# Patient Record
Sex: Male | Born: 1945 | Race: Black or African American | Hispanic: No | Marital: Single | Smoking: Current every day smoker
Health system: Southern US, Community
[De-identification: ages and names within clinical notes are randomized; demographics above are authoritative.]

## PROBLEM LIST (undated history)

## (undated) DIAGNOSIS — N400 Enlarged prostate without lower urinary tract symptoms: Secondary | ICD-10-CM

## (undated) DIAGNOSIS — R0600 Dyspnea, unspecified: Secondary | ICD-10-CM

## (undated) DIAGNOSIS — I471 Supraventricular tachycardia, unspecified: Secondary | ICD-10-CM

## (undated) DIAGNOSIS — R002 Palpitations: Secondary | ICD-10-CM

## (undated) DIAGNOSIS — I10 Essential (primary) hypertension: Secondary | ICD-10-CM

## (undated) DIAGNOSIS — Z720 Tobacco use: Secondary | ICD-10-CM

## (undated) DIAGNOSIS — R55 Syncope and collapse: Secondary | ICD-10-CM

## (undated) DIAGNOSIS — R0602 Shortness of breath: Secondary | ICD-10-CM

## (undated) DIAGNOSIS — R0609 Other forms of dyspnea: Secondary | ICD-10-CM

## (undated) DIAGNOSIS — R079 Chest pain, unspecified: Secondary | ICD-10-CM

## (undated) DIAGNOSIS — I472 Ventricular tachycardia, unspecified: Secondary | ICD-10-CM

## (undated) DIAGNOSIS — I4891 Unspecified atrial fibrillation: Secondary | ICD-10-CM

## (undated) DIAGNOSIS — R0989 Other specified symptoms and signs involving the circulatory and respiratory systems: Secondary | ICD-10-CM

## (undated) DIAGNOSIS — K922 Gastrointestinal hemorrhage, unspecified: Secondary | ICD-10-CM

## (undated) DIAGNOSIS — K219 Gastro-esophageal reflux disease without esophagitis: Secondary | ICD-10-CM

## (undated) HISTORY — DX: Shortness of breath: R06.02

## (undated) HISTORY — DX: Syncope and collapse: R55

## (undated) HISTORY — DX: Supraventricular tachycardia: I47.1

## (undated) HISTORY — DX: Other forms of dyspnea: R06.09

## (undated) HISTORY — DX: Ventricular tachycardia: I47.2

## (undated) HISTORY — DX: Chest pain, unspecified: R07.9

## (undated) HISTORY — DX: Palpitations: R00.2

## (undated) HISTORY — DX: Gastrointestinal hemorrhage, unspecified: K92.2

## (undated) HISTORY — DX: Supraventricular tachycardia, unspecified: I47.10

## (undated) HISTORY — DX: Dyspnea, unspecified: R06.00

## (undated) HISTORY — DX: Ventricular tachycardia, unspecified: I47.20

## (undated) HISTORY — DX: Other specified symptoms and signs involving the circulatory and respiratory systems: R09.89

## (undated) HISTORY — DX: Gastro-esophageal reflux disease without esophagitis: K21.9

## (undated) HISTORY — DX: Essential (primary) hypertension: I10

## (undated) HISTORY — DX: Tobacco use: Z72.0

---

## 2012-04-20 DIAGNOSIS — K922 Gastrointestinal hemorrhage, unspecified: Secondary | ICD-10-CM

## 2012-04-20 HISTORY — DX: Gastrointestinal hemorrhage, unspecified: K92.2

## 2013-02-17 ENCOUNTER — Encounter: Payer: Self-pay | Admitting: Cardiovascular Disease

## 2013-02-18 DIAGNOSIS — R079 Chest pain, unspecified: Secondary | ICD-10-CM

## 2013-02-18 DIAGNOSIS — R55 Syncope and collapse: Secondary | ICD-10-CM

## 2013-02-18 HISTORY — DX: Chest pain, unspecified: R07.9

## 2013-02-18 HISTORY — DX: Syncope and collapse: R55

## 2013-02-20 ENCOUNTER — Encounter: Payer: Self-pay | Admitting: *Deleted

## 2013-02-20 ENCOUNTER — Other Ambulatory Visit: Payer: Self-pay | Admitting: *Deleted

## 2013-02-21 ENCOUNTER — Encounter (HOSPITAL_COMMUNITY): Payer: Self-pay | Admitting: *Deleted

## 2013-02-21 ENCOUNTER — Encounter (HOSPITAL_COMMUNITY): Payer: Self-pay | Admitting: Pharmacy Technician

## 2013-02-21 ENCOUNTER — Encounter (HOSPITAL_COMMUNITY)
Admission: RE | Disposition: A | Discharge status: Home / Self Care | Payer: Self-pay | Source: Ambulatory Visit | Attending: Cardiovascular Disease

## 2013-02-21 ENCOUNTER — Ambulatory Visit (HOSPITAL_COMMUNITY)
Admission: RE | Admit: 2013-02-21 | Discharge: 2013-02-21 | Disposition: A | Discharge status: Home / Self Care | Payer: BC Managed Care – PPO | Source: Ambulatory Visit | Attending: Cardiovascular Disease | Admitting: Cardiovascular Disease

## 2013-02-21 DIAGNOSIS — R55 Syncope and collapse: Secondary | ICD-10-CM | POA: Insufficient documentation

## 2013-02-21 DIAGNOSIS — I4891 Unspecified atrial fibrillation: Secondary | ICD-10-CM

## 2013-02-21 DIAGNOSIS — I472 Ventricular tachycardia, unspecified: Secondary | ICD-10-CM | POA: Insufficient documentation

## 2013-02-21 DIAGNOSIS — R079 Chest pain, unspecified: Secondary | ICD-10-CM | POA: Insufficient documentation

## 2013-02-21 DIAGNOSIS — I2089 Other forms of angina pectoris: Secondary | ICD-10-CM

## 2013-02-21 DIAGNOSIS — I10 Essential (primary) hypertension: Secondary | ICD-10-CM | POA: Insufficient documentation

## 2013-02-21 DIAGNOSIS — Z87898 Personal history of other specified conditions: Secondary | ICD-10-CM

## 2013-02-21 DIAGNOSIS — F17200 Nicotine dependence, unspecified, uncomplicated: Secondary | ICD-10-CM | POA: Insufficient documentation

## 2013-02-21 DIAGNOSIS — I4729 Other ventricular tachycardia: Secondary | ICD-10-CM | POA: Insufficient documentation

## 2013-02-21 DIAGNOSIS — I208 Other forms of angina pectoris: Secondary | ICD-10-CM

## 2013-02-21 DIAGNOSIS — Z720 Tobacco use: Secondary | ICD-10-CM

## 2013-02-21 DIAGNOSIS — Z8679 Personal history of other diseases of the circulatory system: Secondary | ICD-10-CM

## 2013-02-21 HISTORY — DX: Unspecified atrial fibrillation: I48.91

## 2013-02-21 HISTORY — PX: LEFT HEART CATHETERIZATION WITH CORONARY ANGIOGRAM: SHX5451

## 2013-02-21 LAB — CBC
Hemoglobin: 12.5 g/dL — ABNORMAL LOW (ref 13.0–17.0)
MCH: 30.2 pg (ref 26.0–34.0)
MCHC: 34.6 g/dL (ref 30.0–36.0)
Platelets: 267 10*3/uL (ref 150–400)
RBC: 4.14 MIL/uL — ABNORMAL LOW (ref 4.22–5.81)
WBC: 8.7 10*3/uL (ref 4.0–10.5)

## 2013-02-21 LAB — BASIC METABOLIC PANEL
BUN: 13 mg/dL (ref 6–23)
CO2: 24 mEq/L (ref 19–32)
Calcium: 8.8 mg/dL (ref 8.4–10.5)
Chloride: 107 mEq/L (ref 96–112)
Creatinine, Ser: 0.8 mg/dL (ref 0.50–1.35)
GFR calc Af Amer: >90 mL/min (ref >90)
GFR calc non Af Amer: >90 mL/min (ref >90)
Potassium: 3.7 mEq/L (ref 3.5–5.1)

## 2013-02-21 LAB — PROTIME-INR
INR: 1.14 (ref 0.00–1.49)
Prothrombin Time: 14.4 seconds (ref 11.6–15.2)

## 2013-02-21 SURGERY — LEFT HEART CATHETERIZATION WITH CORONARY ANGIOGRAM
Anesthesia: LOCAL

## 2013-02-21 MED ORDER — SODIUM CHLORIDE 0.9 % IJ SOLN: 3.0000 mL | INTRAMUSCULAR | Status: DC | PRN

## 2013-02-21 MED ORDER — ONDANSETRON HCL 4 MG/2ML IJ SOLN: 4.0000 mg | Freq: Four times a day (QID) | INTRAMUSCULAR | Status: DC | PRN

## 2013-02-21 MED ORDER — ACETAMINOPHEN 325 MG PO TABS: 650.0000 mg | ORAL_TABLET | ORAL | Status: DC | PRN

## 2013-02-21 MED ORDER — ASPIRIN 81 MG PO CHEW: 243.0000 mg | CHEWABLE_TABLET | Freq: Once | ORAL | Status: DC

## 2013-02-21 MED ORDER — ASPIRIN 81 MG PO CHEW
CHEWABLE_TABLET | ORAL | Status: AC
  Filled 2013-02-21: qty 1

## 2013-02-21 MED ORDER — SODIUM CHLORIDE 0.9 % IV SOLN
INTRAVENOUS | Status: DC
  Administered 2013-02-21: 08:00:00 via INTRAVENOUS

## 2013-02-21 MED ORDER — SODIUM CHLORIDE 0.9 % IV SOLN: INTRAVENOUS | Status: AC

## 2013-02-21 MED ORDER — METOPROLOL SUCCINATE ER 25 MG PO TB24: 25.0000 mg | ORAL_TABLET | Freq: Every day | ORAL | Status: DC

## 2013-02-21 MED ORDER — ASPIRIN 81 MG PO CHEW
81.0000 mg | CHEWABLE_TABLET | ORAL | Status: AC
  Administered 2013-02-21: 81 mg via ORAL

## 2013-02-21 MED ORDER — SODIUM CHLORIDE 0.9 % IV SOLN: 250.0000 mL | INTRAVENOUS | Status: DC | PRN

## 2013-02-21 MED ORDER — SODIUM CHLORIDE 0.9 % IJ SOLN: 3.0000 mL | Freq: Two times a day (BID) | INTRAMUSCULAR | Status: DC

## 2013-02-21 NOTE — H&P (Signed)
PCP: Dr. Lollie Marrow  Chief Complaint: Chest Pain. Syncope, NSVT  HPI: The patient is a 67 y/o AAM, followed by Dr. Hanley Hays of [facility], who has been referred for a diagnotic LHC, in the setting of recent chest pain and a syncopal episode. His past medical history is significant for HTN,  a 50 + year history of tobacco abuse and an enlarged prostate. He reports intermittent episodes of substernal chest pressure x 4 months. It occurs both at rest and with exertion. No other associated symptoms. He also reports a syncopal episode 2 weeks ago. He was sitting at the time and did not fall. He reports experiencing palpations and dizziness prior to the event. He was seen by Dr. Hanley Hays for this and subsequently was placed on a Holter monitor. The monitor captured ventricular tachycardia. He was then referred to Dr. Allyson Sabal for definitive assessment of his coronaries. He denies a past history of MI. He has never had a heart catheterization. He denies any family history of MI or sudden cardiac death. No prior history of arrhthymias or any other cardiac history. He denies any additional syncopal episodes since his initial event 2 weeks ago. He is currently CP free. He denies recent fever, chills, n/v, diarrhea. No history of bleeding disorders.   Past Medical History  Diagnosis Date  . HTN (hypertension)   . Chest pain   . GI bleed   . Carotid bruit   . VT (ventricular tachycardia)     No past surgical history on file.  Family History  Problem Relation Age of Onset  . Hyperlipidemia Mother   . Hyperlipidemia Father   . Hyperlipidemia Brother   . Hypertension Mother   . Hypertension Father   . Hypertension Sister   . Hypertension Brother    Social History:  reports that he has been smoking.  He does not have any smokeless tobacco history on file. He reports that he drinks alcohol. His drug history is not on file.  Allergies: No Known Allergies  Medications Prior to Admission    Medication Sig Dispense Refill  . amLODipine-olmesartan (AZOR) 10-40 MG per tablet Take 1 tablet by mouth daily.      . finasteride (PROSCAR) 5 MG tablet Take 5 mg by mouth daily.      . Multiple Vitamin (MULTIVITAMIN WITH MINERALS) TABS tablet Take 1 tablet by mouth daily.      . tamsulosin (FLOMAX) 0.4 MG CAPS capsule Take 0.4 mg by mouth daily.        Results for orders placed during the hospital encounter of 02/21/13 (from the past 48 hour(s))  PROTIME-INR     Status: None   Collection Time    02/21/13  8:00 AM      Result Value Range   Prothrombin Time 14.4  11.6 - 15.2 seconds   INR 1.14  0.00 - 1.49  CBC     Status: Abnormal   Collection Time    02/21/13  8:00 AM      Result Value Range   WBC 8.7  4.0 - 10.5 K/uL   RBC 4.14 (*) 4.22 - 5.81 MIL/uL   Hemoglobin 12.5 (*) 13.0 - 17.0 g/dL   HCT 16.1 (*) 09.6 - 04.5 %   MCV 87.2  78.0 - 100.0 fL   MCH 30.2  26.0 - 34.0 pg   MCHC 34.6  30.0 - 36.0 g/dL   RDW 40.9  81.1 - 91.4 %   Platelets 267  150 - 400 K/uL  No results found.  Review of Systems  Constitutional: Negative for fever and chills.  Respiratory: Negative for shortness of breath.   Cardiovascular: Positive for chest pain and palpitations. Negative for orthopnea, claudication, leg swelling and PND.  Gastrointestinal: Negative for nausea, vomiting, blood in stool and melena.  Musculoskeletal: Negative for falls.  Neurological: Positive for dizziness and loss of consciousness.  Endo/Heme/Allergies: Does not bruise/bleed easily.  All other systems reviewed and are negative.    Blood pressure 132/82, pulse 55, temperature 97.8 F (36.6 C), temperature source Oral, resp. rate 18, height 5\' 7"  (1.702 m), weight 126 lb (57.153 kg), SpO2 97.00%. Physical Exam  Constitutional: He is oriented to person, place, and time. He appears well-developed and well-nourished. No distress.  Neck: No JVD present. Carotid bruit is not present.  Cardiovascular: Normal rate,  regular rhythm, normal heart sounds and intact distal pulses.  Exam reveals no gallop and no friction rub.   No murmur heard. Pulses:      Radial pulses are 2+ on the right side, and 2+ on the left side.       Dorsalis pedis pulses are 2+ on the right side, and 2+ on the left side.  Respiratory: Effort normal. No respiratory distress. He has wheezes (faint expiratory wheezing in LLL). He has no rales.  GI: Soft. Bowel sounds are normal. He exhibits no distension and no mass. There is no tenderness.  Musculoskeletal: He exhibits no edema.  Neurological: He is alert and oriented to person, place, and time.  Skin: Skin is warm and dry. He is not diaphoretic.  Psychiatric: He has a normal mood and affect. His behavior is normal.     Assessment/Plan Principal Problem:   Angina at rest Active Problems:   HTN (hypertension)   Tobacco abuse   H/O syncope- 01/2013   H/O ventricular tachycardia -01/2013  Plan: 67 y/o AAM with h/o HTN, long standing tobacco abuse, recent CP, syncope and documented ventricular tachycardia, captured on Holter Monitor, presents for diagnostic LHC. Exam benign. Vitals stable. Proceed with procedure with Dr. Allyson Sabal. May require possible PCI. Pt has been NPO since midnight.   Allayne Butcher, PA-C  02/21/2013, 8:42 AM   Agree with note written by Boyce Medici  PAC  CP, Syncope, NSVT on holter monitoring and +CRF referred for cath  [facility] J 02/21/2013 9:31 AM

## 2013-02-21 NOTE — Interval H&P Note (Signed)
Cath Lab Visit (complete for each Cath Lab visit)  Clinical Evaluation Leading to the Procedure:   ACS: no  Non-ACS:    Anginal Classification: CCS III  Anti-ischemic medical therapy: Minimal Therapy (1 class of medications)  Non-Invasive Test Results: No non-invasive testing performed  Prior CABG: No previous CABG      History and Physical Interval Note:  02/21/2013 9:35 AM  Tyler Hunt  has presented today for surgery, with the diagnosis of vtach  The various methods of treatment have been discussed with the patient and family. After consideration of risks, benefits and other options for treatment, the patient has consented to  Procedure(s): LEFT HEART CATHETERIZATION WITH CORONARY ANGIOGRAM (N/A) as a surgical intervention .  The patient's history has been reviewed, patient examined, no change in status, stable for surgery.  I have reviewed the patient's chart and labs.  Questions were answered to the patient's satisfaction.     [name]

## 2013-02-21 NOTE — CV Procedure (Signed)
Tyler Hunt is a 67 y.o. male    [id number] LOCATION:  FACILITY: [facility]  PHYSICIAN: Nanetta Batty, M.D. [birth year]   DATE OF PROCEDURE:  02/21/2013  DATE OF DISCHARGE:     CARDIAC CATHETERIZATION     History obtained from chart review.The patient is a 67 y/o AAM, followed by Dr. Hanley Hays of [facility], who has been referred for a diagnotic LHC, in the setting of recent chest pain and a syncopal episode. His past medical history is significant for HTN, a 50 + year history of tobacco abuse and an enlarged prostate. He reports intermittent episodes of substernal chest pressure x 4 months. It occurs both at rest and with exertion. No other associated symptoms. He also reports a syncopal episode 2 weeks ago. He was sitting at the time and did not fall. He reports experiencing palpations and dizziness prior to the event. He was seen by Dr. Hanley Hays for this and subsequently was placed on a Holter monitor. The monitor captured ventricular tachycardia. He was then referred to Dr. Allyson Sabal for definitive assessment of his coronaries. He denies a past history of MI. He has never had a heart catheterization. He denies any family history of MI or sudden cardiac death. No prior history of arrhthymias or any other cardiac history. He denies any additional syncopal episodes since his initial event 2 weeks ago. He is currently CP free. He denies recent fever, chills, n/v, diarrhea. No history of bleeding disorders.     PROCEDURE DESCRIPTION:   The patient was brought to the second floor [facility] cath lab in the postabsorptive state. He was not premedicated . His right groinwas prepped and shaved in usual sterile fashion. Xylocaine 1% was used for local anesthesia. A 5 French sheath was inserted into the right common femoral artery using standard Seldinger technique. 5 French right and left Judkins diagnostic catheters along with a 5 French pigtail catheter were used for selective coronary  angiography and left ventriculography respectively. Visipaque dye was used for the entirety of the case. Retrograde aorta, left ventricular and pullback pressures were recorded.  HEMODYNAMICS:    AO SYSTOLIC/AO DIASTOLIC: 149/69   LV SYSTOLIC/LV DIASTOLIC: 150/11  ANGIOGRAPHIC RESULTS:   1. Left main; normal  2. LAD; normal 3. Left circumflex; normal.  4. Right coronary artery; dominant with mild irregularities 5. Left ventriculography; RAO left ventriculogram was performed using  25 mL of Visipaque dye at 12 mL/second. The overall LVEF estimated  60 % Without wall motion abnormalities  IMPRESSION:Tyler Hunt  had normal coronary arteries and normal left ventricular function. I believe his chest pain is noncardiac. The etiology of his ventricular tachycardia and syncope is still unclear. A femoral art arteriogram was performed and the right common femoral artery was sealed with a "MYNX " closure device successfully. The patient will remain recumbent for 2 hours and then discharged home with followup arranged and Dr. Rosalia Hammers Rosario's office who has been notified of these results  [name]. MD, [facility] 02/21/2013 10:11 AM

## 2013-02-21 NOTE — Consult Note (Signed)
ELECTROPHYSIOLOGY CONSULT NOTE    Patient ID: Tyler Hunt MRN: [id number], DOB/AGE: 65-Jun-1947 65 y.o.  Admit date: 02/21/2013 Date of Consult: 02-21-2013  Primary Physician: Lollie Marrow MD Primary Cardiologist: Nanetta Batty, MD  Reason for Consultation: syncope and WCT  HPI:  Tyler Hunt is a 67 year old male with a past medical history of hypertension, BPH and syncope.  He has been followed by his PCP for his hypertension and maintained on Azor with good blood pressure control.  He had a syncopal episode about 2 weeks ago.  At that time, he had been changing a tire and sat on his porch.  He then became dizzy and went inside to sit down.  His vision then became blurry.  He denies CP or palpitations with the episode.  He reports having a long prodrome with diaphoresis and then passed out.  His friends that were with him say that he was out for "about a minute".  He friend says that he was not diaphoretic but did have "shaking".  He states that he had not had breakfast that morning, but had drank one beer and smoked some marijuana.  When he regained consciousness, he had no other symptoms and went to work. He was evaluated by his PCP with a holter monitor which by report demonstrated wide complex tachycardia (review of the monitor demonstrates atrial fibrillation with RVR and abberation).  Importantly, the patient had no symptoms of palpitations or dizziness while wearing the monitor.  Due to chest pain and syncope, he was referred for cardiac catheterization today.  This demonstrated normal coronaries with normal LV function.  There is no recent echocardiogram or TSH available.  The patient lives in [facility] and desires to have his care as close to home as possible.    He denies shortness of breath, palpitations, dizziness, or other syncopal spells. He has no functional limitations.   EP has been asked to evaluate for treatment options.   Past Medical History  Diagnosis Date  . HTN  (hypertension)   . Chest pain   . GI bleed   . Carotid bruit   . Atrial fibrillation     01-2013 - identified on holter monitor, RVR with abberration     Surgical History: No past surgical history on file.   Prescriptions prior to admission  Medication Sig Dispense Refill  . amLODipine-olmesartan (AZOR) 10-40 MG per tablet Take 1 tablet by mouth daily.      . finasteride (PROSCAR) 5 MG tablet Take 5 mg by mouth daily.      . Multiple Vitamin (MULTIVITAMIN WITH MINERALS) TABS tablet Take 1 tablet by mouth daily.      . tamsulosin (FLOMAX) 0.4 MG CAPS capsule Take 0.4 mg by mouth daily.        Inpatient Medications:  . aspirin      . aspirin  243 mg Oral Once  . sodium chloride  3 mL Intravenous Q12H    Allergies: No Known Allergies  History   Social History  . Marital Status: Single    Spouse Name: N/A    Number of Children: N/A  . Years of Education: N/A   Occupational History  . Not on file.   Social History Main Topics  . Smoking status: Current Every Day Smoker  . Smokeless tobacco: Not on file  . Alcohol Use: Yes  . Drug Use: Not on file  . Sexual Activity: Not on file   Other Topics Concern  . Not on  file   Social History Narrative  . No narrative on file     Family History  Problem Relation Age of Onset  . Hyperlipidemia Mother   . Hyperlipidemia Father   . Hyperlipidemia Brother   . Hypertension Mother   . Hypertension Father   . Hypertension Sister   . Hypertension Brother     Physical Exam: Filed Vitals:   02/21/13 1137 02/21/13 1200 02/21/13 1206 02/21/13 1211  BP: 143/73 139/69 140/69 137/81  Pulse: 53 55 65 69  Temp:      TempSrc:      Resp:   20 18  Height:      Weight:      SpO2: 100% 99% 100% 100%    GEN- The patient is well appearing, alert and oriented x 3 today.   Head- normocephalic, atraumatic Eyes-  Sclera clear, conjunctiva pink Ears- hearing intact Oropharynx- clear Neck- supple,   Lungs- Clear to ausculation  bilaterally, normal work of breathing Heart- Regular rate and rhythm, no murmurs, rubs or gallops, PMI not laterally displaced GI- soft, NT, ND, + BS Extremities- no clubbing, cyanosis, or edema MS- no significant deformity or atrophy Skin- no rash or lesion Psych- euthymic mood, full affect Neuro- strength and sensation are intact  Labs:   Lab Results  Component Value Date   WBC 8.7 02/21/2013   HGB 12.5* 02/21/2013   HCT 36.1* 02/21/2013   MCV 87.2 02/21/2013   PLT 267 02/21/2013     Recent Labs Lab 02/21/13 0800  NA 139  K 3.7  CL 107  CO2 24  BUN 13  CREATININE 0.80  CALCIUM 8.8  GLUCOSE 96    QIO:NGEXB brady at 51bpm, normal intervals  Assessment and Plan:  1. Syncope Unclear etiology His EF is preserved and EKG is unremarkable.  I would recommend echo to evaluate RV function and evaluate for any other structural abnormalities.   His history is suggestive of a vasovagal syncopal episode, likely potentiated by not eating and taking medicines including flomax.   Given unexplained syncope, I have advised the patient of DMVs requirement that he not drive for 6 months. I will defer Echo and TFTs (see below) to primary cardiologist)  2. WCT I have reviewed the patients event monitor which reveals an irregular wide complex tachycardia.  This is most likely afib or atrial tachycardia with abbarancy.  I will start toprol XL 25mg  daily.  TFTs and echo. Follow-up with primary cardiologist No further EP workup is planned at this time.

## 2013-02-21 NOTE — Progress Notes (Signed)
Pt seen by Dr Johney Frame for syncope, NSVT. He should have an echo and TSH as an OP. He will be sent home on Toprol 25 mg daily. He has been instructed not to drive for 6 months.  Corine Shelter PA-C 02/21/2013 11:28 AM

## 2013-02-23 ENCOUNTER — Encounter: Payer: Self-pay | Admitting: Cardiovascular Disease

## 2013-03-02 ENCOUNTER — Encounter: Payer: Self-pay | Admitting: Cardiovascular Disease

## 2013-03-22 ENCOUNTER — Institutional Professional Consult (permissible substitution): Payer: BC Managed Care – PPO | Admitting: Internal Medicine

## 2013-04-26 ENCOUNTER — Encounter: Payer: Self-pay | Admitting: Internal Medicine

## 2013-04-27 ENCOUNTER — Institutional Professional Consult (permissible substitution): Payer: BC Managed Care – PPO | Admitting: Internal Medicine

## 2013-05-01 ENCOUNTER — Encounter: Payer: Self-pay | Admitting: Internal Medicine

## 2014-02-06 ENCOUNTER — Emergency Department (HOSPITAL_BASED_OUTPATIENT_CLINIC_OR_DEPARTMENT_OTHER)
Admission: EM | Admit: 2014-02-06 | Discharge: 2014-02-06 | Disposition: A | Discharge status: Home / Self Care | Payer: Medicare HMO | Attending: Emergency Medicine | Admitting: Emergency Medicine

## 2014-02-06 ENCOUNTER — Encounter (HOSPITAL_BASED_OUTPATIENT_CLINIC_OR_DEPARTMENT_OTHER): Payer: Self-pay | Admitting: Emergency Medicine

## 2014-02-06 DIAGNOSIS — K625 Hemorrhage of anus and rectum: Secondary | ICD-10-CM | POA: Insufficient documentation

## 2014-02-06 DIAGNOSIS — I10 Essential (primary) hypertension: Secondary | ICD-10-CM | POA: Diagnosis not present

## 2014-02-06 DIAGNOSIS — Z8719 Personal history of other diseases of the digestive system: Secondary | ICD-10-CM | POA: Diagnosis not present

## 2014-02-06 DIAGNOSIS — Z7982 Long term (current) use of aspirin: Secondary | ICD-10-CM | POA: Insufficient documentation

## 2014-02-06 DIAGNOSIS — Z79899 Other long term (current) drug therapy: Secondary | ICD-10-CM | POA: Insufficient documentation

## 2014-02-06 DIAGNOSIS — Z720 Tobacco use: Secondary | ICD-10-CM | POA: Insufficient documentation

## 2014-02-06 LAB — COMPREHENSIVE METABOLIC PANEL
ALBUMIN: 3.4 g/dL — AB (ref 3.5–5.2)
ALK PHOS: 77 U/L (ref 39–117)
ALT: 20 U/L (ref 0–53)
ANION GAP: 10 (ref 5–15)
AST: 20 U/L (ref 0–37)
BUN: 16 mg/dL (ref 6–23)
CO2: 27 mEq/L (ref 19–32)
Calcium: 9.2 mg/dL (ref 8.4–10.5)
Chloride: 106 mEq/L (ref 96–112)
Creatinine, Ser: 1 mg/dL (ref 0.50–1.35)
GFR calc Af Amer: 89 mL/min — ABNORMAL LOW (ref >90)
GFR calc non Af Amer: 77 mL/min — ABNORMAL LOW (ref >90)
Glucose, Bld: 110 mg/dL — ABNORMAL HIGH (ref 70–99)
POTASSIUM: 3.8 meq/L (ref 3.7–5.3)
Sodium: 143 mEq/L (ref 137–147)
Total Bilirubin: 0.2 mg/dL — ABNORMAL LOW (ref 0.3–1.2)
Total Protein: 6.5 g/dL (ref 6.0–8.3)

## 2014-02-06 LAB — CBC WITH DIFFERENTIAL/PLATELET
BASOS PCT: 0 % (ref 0–1)
Basophils Absolute: 0 10*3/uL (ref 0.0–0.1)
EOS ABS: 0.2 10*3/uL (ref 0.0–0.7)
Eosinophils Relative: 3 % (ref 0–5)
HCT: 30.9 % — ABNORMAL LOW (ref 39.0–52.0)
HEMOGLOBIN: 10.1 g/dL — AB (ref 13.0–17.0)
Lymphocytes Relative: 20 % (ref 12–46)
Lymphs Abs: 1 10*3/uL (ref 0.7–4.0)
MCH: 31.8 pg (ref 26.0–34.0)
MCHC: 32.7 g/dL (ref 30.0–36.0)
MCV: 97.2 fL (ref 78.0–100.0)
MONOS PCT: 9 % (ref 3–12)
Monocytes Absolute: 0.5 10*3/uL (ref 0.1–1.0)
NEUTROS PCT: 68 % (ref 43–77)
Neutro Abs: 3.6 10*3/uL (ref 1.7–7.7)
PLATELETS: 222 10*3/uL (ref 150–400)
RBC: 3.18 MIL/uL — ABNORMAL LOW (ref 4.22–5.81)
RDW: 13.7 % (ref 11.5–15.5)
WBC: 5.2 10*3/uL (ref 4.0–10.5)

## 2014-02-06 LAB — OCCULT BLOOD X 1 CARD TO LAB, STOOL: Fecal Occult Bld: POSITIVE — AB

## 2014-02-06 MED ORDER — IPRATROPIUM BROMIDE 0.02 % IN SOLN
0.5000 mg | Freq: Once | RESPIRATORY_TRACT | Status: AC
  Administered 2014-02-06: 0.5 mg via RESPIRATORY_TRACT
  Filled 2014-02-06: qty 2.5

## 2014-02-06 MED ORDER — ALBUTEROL SULFATE (2.5 MG/3ML) 0.083% IN NEBU
5.0000 mg | INHALATION_SOLUTION | Freq: Once | RESPIRATORY_TRACT | Status: AC
  Administered 2014-02-06: 5 mg via RESPIRATORY_TRACT
  Filled 2014-02-06: qty 6

## 2014-02-06 NOTE — Discharge Instructions (Signed)
Please follow the directions provided. Be sure to follow-up with your primary care provider and your GI doctor regarding the recurrent rectal bleeding.  Your hemoglobin (blood count) today is 10.1 and does not need to be replaced with a blood transfusion.  However if you have any new, worsening or concerning symptoms, like chest pain shortness of breath or feeling light-headed, don't hesitate to come back.    SEEK IMMEDIATE MEDICAL CARE IF:  Your bleeding increases.  You feel lightheaded, weak, or you faint.  You have severe cramps in your back or abdomen.  You pass large blood clots in your stool.  Your problems are getting worse.

## 2014-02-06 NOTE — ED Notes (Addendum)
Pt c/o rectal bleeding x 3 months HX of same " some " lower abd pain.

## 2014-02-06 NOTE — ED Notes (Signed)
Pt states he has GI MD but requests GI referral also requests RTW today note

## 2014-02-06 NOTE — ED Provider Notes (Signed)
CSN: [id number]     Arrival date & time 02/06/14  1320 History   First MD Initiated Contact with Patient 02/06/14 1331     Chief Complaint  Patient presents with  . Rectal Bleeding   (Consider location/radiation/quality/duration/timing/severity/associated sxs/prior Treatment) HPI Tyler Hunt is a 68 yo male presenting with reports of bloody loose bowel movements.  He has had this for over 2 years and has been followed by his PCP and GI.  He reports the amount of the bleeding in the loose stools has increased in the last week.  He has also had some abd pain in the last week also which he rates as 3/10. He has had syncopal episodes due to anemia in the past requiring blood transfusions but denies chest pain,  syncopal episodes, shortness of breath, vomitng or melena since this change.    Past Medical History  Diagnosis Date  . Chest pain 02/2013    CP in left anterior and left lateral chest. Described as pressure-like & sharp. Pain is moderate & worsening. Sx exacerbated by lying down and spicy foods. Has associated heartburn, nausea & weakness.  . GI bleed   . Carotid bruit   . Benign hypertension   . Syncope and collapse   . Tobacco abuse   . DOE (dyspnea on exertion)   . Acid reflux   . Dizziness and giddiness   . Carotid bruit   . Palpitations   . GI bleed   . SOB (shortness of breath)   . High risk medication use   . Atrial fibrillation     01-2013 - identified on holter monitor, RVR with abberration  . SVT (supraventricular tachycardia)     Sx include palpitations, chest pain and near syncope. Pain located in substernal area.  . Ventricular tachycardia     ECHO 02/15/13 - EF between 55-60%   History reviewed. No pertinent past surgical history. Family History  Problem Relation Age of Onset  . Hyperlipidemia Mother   . Hyperlipidemia Father   . Hyperlipidemia Brother   . Hypertension Mother   . Hypertension Father   . Hypertension Sister   . Hypertension Brother     History  Substance Use Topics  . Smoking status: Current Every Day Smoker -- 0.50 packs/day for 40 years    Types: Cigarettes  . Smokeless tobacco: Not on file  . Alcohol Use: Yes     Comment: occasional, drinks beer    Review of Systems  Constitutional: Negative for fever and chills.  HENT: Negative for sore throat.   Eyes: Negative for visual disturbance.  Respiratory: Negative for cough and shortness of breath.   Cardiovascular: Negative for chest pain and leg swelling.  Gastrointestinal: Positive for blood in stool. Negative for nausea, vomiting and diarrhea.  Genitourinary: Negative for dysuria.  Musculoskeletal: Negative for myalgias.  Skin: Negative for rash.  Neurological: Negative for weakness, numbness and headaches.      Allergies  Review of patient's allergies indicates no known allergies.  Home Medications   Prior to Admission medications   Medication Sig Start Date End Date Taking? Authorizing Provider  amLODipine-olmesartan (AZOR) 10-40 MG per tablet Take 1 tablet by mouth daily.    Historical Provider, MD  aspirin 81 MG chewable tablet Chew 162 mg by mouth once. 02/21/13   Abelino DerrickLuke K Kilroy, PA-C  finasteride (PROSCAR) 5 MG tablet Take 5 mg by mouth daily as needed.     Historical Provider, MD  Multiple Vitamin (MULTIVITAMIN WITH MINERALS) TABS tablet  Take 1 tablet by mouth daily.    Historical Provider, MD  olmesartan (BENICAR) 40 MG tablet Take 40 mg by mouth daily.    Historical Provider, MD  tamsulosin (FLOMAX) 0.4 MG CAPS capsule Take 0.4 mg by mouth daily.    Historical Provider, MD   BP 121/63  Pulse 78  Temp(Src) 98.4 F (36.9 C) (Oral)  Resp 16  Ht 5\' 7"  (1.702 m)  Wt 125 lb (56.7 kg)  BMI 19.57 kg/m2  SpO2 100% Physical Exam  Nursing note and vitals reviewed. Constitutional: He appears well-developed and well-nourished. No distress.  HENT:  Head: Normocephalic and atraumatic.  Mouth/Throat: Oropharynx is clear and moist. No oropharyngeal  exudate.  Eyes: Conjunctivae are normal.  Neck: Neck supple. No thyromegaly present.  Cardiovascular: Normal rate, regular rhythm and intact distal pulses.   Pulmonary/Chest: Effort normal and breath sounds normal. No respiratory distress. He has no wheezes. He has no rales. He exhibits no tenderness.  Abdominal: Soft. He exhibits no distension and no mass. There is no hepatosplenomegaly. There is no tenderness. There is no rigidity, no rebound, no guarding, no CVA tenderness, no tenderness at McBurney's point and negative Murphy's sign.  Genitourinary: Rectal exam shows no external hemorrhoid, no internal hemorrhoid, no fissure, no mass, no tenderness and anal tone normal. Guaiac positive stool.  Musculoskeletal: He exhibits no tenderness.  Lymphadenopathy:    He has no cervical adenopathy.  Neurological: He is alert.  Skin: Skin is warm and dry. No rash noted. He is not diaphoretic.  Psychiatric: He has a normal mood and affect.    ED Course  Procedures (including critical care time) Labs Review Labs Reviewed  OCCULT BLOOD X 1 CARD TO LAB, STOOL - Abnormal; Notable for the following:    Fecal Occult Bld POSITIVE (*)    All other components within normal limits  CBC WITH DIFFERENTIAL - Abnormal; Notable for the following:    RBC 3.18 (*)    Hemoglobin 10.1 (*)    HCT 30.9 (*)    All other components within normal limits  COMPREHENSIVE METABOLIC PANEL - Abnormal; Notable for the following:    Glucose, Bld 110 (*)    Albumin 3.4 (*)    Total Bilirubin 0.2 (*)    GFR calc non Af Amer 77 (*)    GFR calc Af Amer 89 (*)    All other components within normal limits    Imaging Review No results found.   EKG Interpretation None      MDM   Final diagnoses:  Rectal bleeding   68 yo with report of recurrent bloody stools, has been previously worked-up.    Rectal exam negative for gross blood or structural abnormality, but positive for occult blood. CBC negative for significant  abnormality.  Pt is well-appearing and vital signs stable and without pain.  Pt reassured his hemoglobin is a safe level and discharge instructions include follow-up with GI.  Return precautions provided.  Pt aware of plan and in agreement.     Filed Vitals:   02/06/14 1327 02/06/14 1329 02/06/14 1418 02/06/14 1526  BP:  121/63  110/69  Pulse: 78   71  Temp: 98.4 F (36.9 C)     TempSrc: Oral     Resp: 16   16  Height: 5\' 7"  (1.702 m)     Weight: 125 lb (56.7 kg)     SpO2: 100%  100% 100%   Meds given in ED:  Medications  albuterol (PROVENTIL) (2.5  MG/3ML) 0.083% nebulizer solution 5 mg (5 mg Nebulization Given 02/06/14 1417)  ipratropium (ATROVENT) nebulizer solution 0.5 mg (0.5 mg Nebulization Given 02/06/14 1417)    Discharge Medication List as of 02/06/2014  3:30 PM         Harle Battiest, NP 02/11/14 2238

## 2014-02-12 NOTE — ED Provider Notes (Signed)
Medical screening examination/treatment/procedure(s) were performed by non-physician practitioner and as supervising physician I was immediately available for consultation/collaboration.   EKG Interpretation None        Rolan BuccoMelanie Claudell Wohler, MD 02/12/14 [phone]

## 2014-02-21 ENCOUNTER — Inpatient Hospital Stay (HOSPITAL_BASED_OUTPATIENT_CLINIC_OR_DEPARTMENT_OTHER)
Admission: EM | Admit: 2014-02-21 | Discharge: 2014-02-24 | DRG: 345 | Disposition: A | Discharge status: Home / Self Care | Payer: Medicare HMO | Attending: Internal Medicine | Admitting: Internal Medicine

## 2014-02-21 ENCOUNTER — Encounter (HOSPITAL_BASED_OUTPATIENT_CLINIC_OR_DEPARTMENT_OTHER): Payer: Self-pay | Admitting: *Deleted

## 2014-02-21 DIAGNOSIS — R001 Bradycardia, unspecified: Secondary | ICD-10-CM | POA: Diagnosis present

## 2014-02-21 DIAGNOSIS — K5521 Angiodysplasia of colon with hemorrhage: Principal | ICD-10-CM | POA: Diagnosis present

## 2014-02-21 DIAGNOSIS — I10 Essential (primary) hypertension: Secondary | ICD-10-CM | POA: Diagnosis present

## 2014-02-21 DIAGNOSIS — K2980 Duodenitis without bleeding: Secondary | ICD-10-CM | POA: Diagnosis present

## 2014-02-21 DIAGNOSIS — Z7982 Long term (current) use of aspirin: Secondary | ICD-10-CM

## 2014-02-21 DIAGNOSIS — K922 Gastrointestinal hemorrhage, unspecified: Secondary | ICD-10-CM | POA: Diagnosis present

## 2014-02-21 DIAGNOSIS — K219 Gastro-esophageal reflux disease without esophagitis: Secondary | ICD-10-CM | POA: Diagnosis present

## 2014-02-21 DIAGNOSIS — K2960 Other gastritis without bleeding: Secondary | ICD-10-CM | POA: Diagnosis present

## 2014-02-21 DIAGNOSIS — F17210 Nicotine dependence, cigarettes, uncomplicated: Secondary | ICD-10-CM | POA: Diagnosis present

## 2014-02-21 DIAGNOSIS — D62 Acute posthemorrhagic anemia: Secondary | ICD-10-CM | POA: Diagnosis present

## 2014-02-21 DIAGNOSIS — K5520 Angiodysplasia of colon without hemorrhage: Secondary | ICD-10-CM

## 2014-02-21 DIAGNOSIS — I4891 Unspecified atrial fibrillation: Secondary | ICD-10-CM | POA: Diagnosis present

## 2014-02-21 DIAGNOSIS — K449 Diaphragmatic hernia without obstruction or gangrene: Secondary | ICD-10-CM | POA: Diagnosis present

## 2014-02-21 DIAGNOSIS — K921 Melena: Secondary | ICD-10-CM

## 2014-02-21 HISTORY — DX: Benign prostatic hyperplasia without lower urinary tract symptoms: N40.0

## 2014-02-21 LAB — CBC
HCT: 32.9 % — ABNORMAL LOW (ref 39.0–52.0)
HEMOGLOBIN: 10.8 g/dL — AB (ref 13.0–17.0)
MCH: 31.4 pg (ref 26.0–34.0)
MCHC: 32.8 g/dL (ref 30.0–36.0)
MCV: 95.6 fL (ref 78.0–100.0)
Platelets: 261 10*3/uL (ref 150–400)
RBC: 3.44 MIL/uL — ABNORMAL LOW (ref 4.22–5.81)
RDW: 13.7 % (ref 11.5–15.5)
WBC: 5.5 10*3/uL (ref 4.0–10.5)

## 2014-02-21 LAB — COMPREHENSIVE METABOLIC PANEL
ALK PHOS: 71 U/L (ref 39–117)
ALT: 17 U/L (ref 0–53)
AST: 20 U/L (ref 0–37)
Albumin: 3.5 g/dL (ref 3.5–5.2)
Anion gap: 8 (ref 5–15)
BUN: 19 mg/dL (ref 6–23)
CO2: 26 meq/L (ref 19–32)
Calcium: 8.9 mg/dL (ref 8.4–10.5)
Chloride: 107 mEq/L (ref 96–112)
Creatinine, Ser: 0.9 mg/dL (ref 0.50–1.35)
GFR calc non Af Amer: 87 mL/min — ABNORMAL LOW (ref >90)
GLUCOSE: 100 mg/dL — AB (ref 70–99)
POTASSIUM: 4.2 meq/L (ref 3.7–5.3)
Sodium: 141 mEq/L (ref 137–147)
TOTAL PROTEIN: 6.4 g/dL (ref 6.0–8.3)
Total Bilirubin: <0.2 mg/dL — ABNORMAL LOW (ref 0.3–1.2)

## 2014-02-21 LAB — TYPE AND SCREEN
ABO/RH(D): O POS
Antibody Screen: NEGATIVE

## 2014-02-21 LAB — CBC WITH DIFFERENTIAL/PLATELET
BASOS ABS: 0 10*3/uL (ref 0.0–0.1)
Basophils Relative: 0 % (ref 0–1)
EOS ABS: 0.1 10*3/uL (ref 0.0–0.7)
EOS PCT: 3 % (ref 0–5)
HCT: 33.5 % — ABNORMAL LOW (ref 39.0–52.0)
Hemoglobin: 11.2 g/dL — ABNORMAL LOW (ref 13.0–17.0)
LYMPHS ABS: 1.1 10*3/uL (ref 0.7–4.0)
LYMPHS PCT: 23 % (ref 12–46)
MCH: 32.5 pg (ref 26.0–34.0)
MCHC: 33.4 g/dL (ref 30.0–36.0)
MCV: 97.1 fL (ref 78.0–100.0)
Monocytes Absolute: 0.4 10*3/uL (ref 0.1–1.0)
Monocytes Relative: 8 % (ref 3–12)
NEUTROS PCT: 66 % (ref 43–77)
Neutro Abs: 3.1 10*3/uL (ref 1.7–7.7)
PLATELETS: 255 10*3/uL (ref 150–400)
RBC: 3.45 MIL/uL — ABNORMAL LOW (ref 4.22–5.81)
RDW: 13.5 % (ref 11.5–15.5)
WBC: 4.7 10*3/uL (ref 4.0–10.5)

## 2014-02-21 LAB — PROTIME-INR
INR: 1.14 (ref 0.00–1.49)
Prothrombin Time: 14.6 seconds (ref 11.6–15.2)

## 2014-02-21 LAB — OCCULT BLOOD X 1 CARD TO LAB, STOOL: FECAL OCCULT BLD: POSITIVE — AB

## 2014-02-21 LAB — APTT: aPTT: 31 seconds (ref 24–37)

## 2014-02-21 LAB — ABO/RH: ABO/RH(D): O POS

## 2014-02-21 MED ORDER — IPRATROPIUM-ALBUTEROL 18-103 MCG/ACT IN AERO: 1.0000 | INHALATION_SPRAY | RESPIRATORY_TRACT | Status: DC

## 2014-02-21 MED ORDER — PNEUMOCOCCAL VAC POLYVALENT 25 MCG/0.5ML IJ INJ
0.5000 mL | INJECTION | INTRAMUSCULAR | Status: AC
  Administered 2014-02-22: 0.5 mL via INTRAMUSCULAR
  Filled 2014-02-21: qty 0.5

## 2014-02-21 MED ORDER — CHLORHEXIDINE GLUCONATE 0.12 % MT SOLN
15.0000 mL | Freq: Two times a day (BID) | OROMUCOSAL | Status: DC
  Administered 2014-02-21 – 2014-02-24 (×5): 15 mL via OROMUCOSAL
  Filled 2014-02-21 (×8): qty 15

## 2014-02-21 MED ORDER — MORPHINE SULFATE 4 MG/ML IJ SOLN
4.0000 mg | Freq: Once | INTRAMUSCULAR | Status: AC
  Administered 2014-02-21: 4 mg via INTRAVENOUS
  Filled 2014-02-21: qty 1

## 2014-02-21 MED ORDER — SODIUM CHLORIDE 0.9 % IV BOLUS (SEPSIS)
1000.0000 mL | Freq: Once | INTRAVENOUS | Status: AC
  Administered 2014-02-21: 1000 mL via INTRAVENOUS

## 2014-02-21 MED ORDER — PANTOPRAZOLE SODIUM 40 MG IV SOLR
40.0000 mg | Freq: Two times a day (BID) | INTRAVENOUS | Status: DC
  Administered 2014-02-21: 40 mg via INTRAVENOUS
  Filled 2014-02-21 (×3): qty 40

## 2014-02-21 MED ORDER — IPRATROPIUM-ALBUTEROL 0.5-2.5 (3) MG/3ML IN SOLN
3.0000 mL | RESPIRATORY_TRACT | Status: DC
Start: 2014-02-21 — End: 2014-02-21
  Administered 2014-02-21: 3 mL via RESPIRATORY_TRACT
  Filled 2014-02-21: qty 3

## 2014-02-21 MED ORDER — SODIUM CHLORIDE 0.9 % IV SOLN: 80.0000 mg | Freq: Once | INTRAVENOUS | Status: DC

## 2014-02-21 MED ORDER — IPRATROPIUM-ALBUTEROL 0.5-2.5 (3) MG/3ML IN SOLN
3.0000 mL | RESPIRATORY_TRACT | Status: DC | PRN
  Administered 2014-02-23: 3 mL via RESPIRATORY_TRACT
  Filled 2014-02-21: qty 3

## 2014-02-21 MED ORDER — SODIUM CHLORIDE 0.9 % IV SOLN
INTRAVENOUS | Status: DC
  Administered 2014-02-21 – 2014-02-23 (×3): via INTRAVENOUS

## 2014-02-21 MED ORDER — ADULT MULTIVITAMIN W/MINERALS CH
1.0000 | ORAL_TABLET | Freq: Every day | ORAL | Status: DC
  Administered 2014-02-21 – 2014-02-24 (×4): 1 via ORAL
  Filled 2014-02-21 (×4): qty 1

## 2014-02-21 MED ORDER — PANTOPRAZOLE SODIUM 40 MG IV SOLR
INTRAVENOUS | Status: AC
  Administered 2014-02-21: 80 mg
  Filled 2014-02-21: qty 80

## 2014-02-21 MED ORDER — CETYLPYRIDINIUM CHLORIDE 0.05 % MT LIQD
7.0000 mL | Freq: Two times a day (BID) | OROMUCOSAL | Status: DC
  Administered 2014-02-22 – 2014-02-23 (×3): 7 mL via OROMUCOSAL

## 2014-02-21 MED ORDER — FINASTERIDE 5 MG PO TABS
5.0000 mg | ORAL_TABLET | Freq: Every day | ORAL | Status: DC
  Administered 2014-02-21 – 2014-02-24 (×4): 5 mg via ORAL
  Filled 2014-02-21 (×4): qty 1

## 2014-02-21 NOTE — ED Notes (Signed)
Security notified about pt car being left here d/t him being admitted. Security came to talk with pt.

## 2014-02-21 NOTE — Consult Note (Signed)
[facility] Consult: 10:16 AM 02/22/2014  LOS: 1 day    Referring Provider: Dr Eliseo Squires Primary Care Physician:   Primary Gastroenterologist:  Althia Forts.  Dr Alice Reichert in [place], fax [phone].    Reason for Consultation:  Hematochezia.    HPI: Tyler Hunt is a 68 y.o. male.  Hx PBH with elevated PSA but multiple benign prostate biopsies. non-cardiac chest pain. Hx of Afib with RVR but only on 81 Mg ASA. Hx anemia with Hgb of 9.1 07/13/12, MCV 90. FOB + stool 06/14/12. Previous syncope related to anemia in past.  Has had EGD/colonoscopy/capsue endo in 2014 and repeat capsule study 10/2013 for long standing rectal bleeding and hx of IDA, treated with oral iron .  Results of study noted below.  Last GI office visit with PA was 01/15/14 for rectal bleeding. Hgb 12/05/2013 was 12.8, on 01/22/14 it was 12.1. On 02/06/14 hgb was 10.1.  On that day, October 20 he had gone [place] because he was dissatisfied with the slow pace of care from his GI doctor regarding his rectal bleeding. He was given a referral number to [facility] and has an appointment coming up for next week to meet with the physician assistant   Presented to [facility] ED on 11/4 with hematochezia.  Has had this intermittently for 2 years.  Bleeding and loose stools increased in last week. Some lower abdominal pain.  Passes blood in stool. Hgb 11.2 in ED and vital signs stable.  This AM it is 10.0. Patient says that normally he moved his bowels every 1-2 days. The stool is a little bit dark but not melenic as he does take oral iron. Mixed in with the stool is some red blood and this happens almost every time he has a bowel movement. Normally the volume of blood he sees is small either on the paper or splashing on the side of the  commode. However yesterday the one episode of hematochezia was large and filled the commode with blood. He felt a little bit lightheaded but was not presyncopal. Patient has not had nausea or vomiting on any occasion. Occasionally has some heartburn.  He uses 81 mg aspirin daily but no other NSAIDs. He denies any other unusual bleeding tendencies.     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   06/15/12  EGD Mild erosive gastritis. h pylori negative.   Past Medical History  Diagnosis Date  . Chest pain 02/2013    CP in left anterior and left lateral chest. Described as pressure-like & sharp. Pain is moderate & worsening. Sx exacerbated by lying down and spicy foods. Has associated heartburn, nausea & weakness.  . Carotid bruit   . Benign hypertension   . Syncope and collapse 02/2013    felt to be vasovagal.   . Tobacco abuse   . DOE (dyspnea on  exertion)   . Acid reflux   . Carotid bruit   . Palpitations   . GI bleed   . SOB (shortness of breath)   . Atrial fibrillation     01-2013 - identified on holter monitor, RVR with abberration  . SVT (supraventricular tachycardia)     Sx include palpitations, chest pain and near syncope. Pain located in substernal area.  . Ventricular tachycardia     ECHO 02/15/13 - EF between 55-60%  . Enlarged prostate     History reviewed. No pertinent past surgical history.  Prior to Admission medications   Medication Sig Start Date End Date Taking? Authorizing Provider  amLODipine-olmesartan (AZOR) 10-40 MG per tablet Take 1 tablet by mouth daily.   Yes Historical Provider, MD  aspirin 81 MG chewable tablet Chew 162 mg by mouth once. 02/21/13  Yes Luke K Kilroy, PA-C  finasteride (PROSCAR) 5 MG tablet Take 5 mg by mouth daily as needed.    Yes  Historical Provider, MD  Multiple Vitamin (MULTIVITAMIN WITH MINERALS) TABS tablet Take 1 tablet by mouth daily.   Yes Historical Provider, MD  olmesartan (BENICAR) 40 MG tablet Take 40 mg by mouth daily.   Yes Historical Provider, MD  tamsulosin (FLOMAX) 0.4 MG CAPS capsule Take 0.4 mg by mouth daily.   Yes Historical Provider, MD    Scheduled Meds: . antiseptic oral rinse  7 mL Mouth Rinse q12n4p  . bisacodyl  5 mg Oral Once   And  . bisacodyl  5 mg Oral Once  . chlorhexidine  15 mL Mouth Rinse BID  . finasteride  5 mg Oral Daily  . multivitamin with minerals  1 tablet Oral Daily  . pantoprazole (PROTONIX) IV  80 mg Intravenous Once  . pantoprazole (PROTONIX) IV  40 mg Intravenous Q12H  . peg 3350 powder  1 kit Oral Once  . pneumococcal 23 valent vaccine  0.5 mL Intramuscular Tomorrow-1000   Infusions: . sodium chloride 125 mL/hr at 02/21/14 2027   PRN Meds: ipratropium-albuterol   Allergies as of 02/21/2014  . (No Known Allergies)    Family History  Problem Relation Age of Onset  . Hyperlipidemia Mother   . Hyperlipidemia Father   . Hyperlipidemia Brother   . Hypertension Mother   . Hypertension Father   . Hypertension Sister   . Hypertension Brother     History   Social History  . Marital Status: Single    Spouse Name: N/A    Number of Children: N/A  . Years of Education: N/A   Occupational History  . Not on file.   Social History Main Topics  . Smoking status: Current Every Day Smoker -- 0.50 packs/day for 40 years    Types: Cigarettes  . Smokeless tobacco: Not on file  . Alcohol Use: Yes     Comment: occasional, drinks beer  . Drug Use: Not on file  . Sexual Activity: Not on file   Other Topics Concern  . 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    REVIEW OF SYSTEMS: Constitutional:  Weight stable. No limitations in ability to perform work. ENT:  No nose  bleeds Pulm:  No shortness of breath, no cough CV:  No palpitations, no LE edema.  GU:  No hematuria, no frequency GI:  No dysphagia. Heme:  Besides the GI bleeding no other unusual oral, nasal bleeding and no excessive bruising  Transfusions:  none Neuro:  No headaches, no peripheral tingling or numbness Derm:  No itching, no rash or sores.  Endocrine:  No sweats or chills.  No polyuria or dysuria Immunization:  Not querried Travel:  None beyond local counties in last few months.    PHYSICAL EXAM: Vital signs in last 24 hours: Filed Vitals:   02/22/14 0535  BP: 114/56  Pulse: 42  Temp: 98.6 F (37 C)  Resp: 18   Wt Readings from Last 3 Encounters:  02/21/14 125 lb (56.7 kg)  02/06/14 125 lb (56.7 kg)  02/21/13 126 lb (57.153 kg)    General: pleasant, nonobese, comfortable AAM appearing stated age. Head:  No swelling or asymmetry  Eyes:  No icterus, no pallor Ears:  No hearing loss  Nose:  No congestion or discharge Mouth:  Clear, moist, fair dentition with several missing teeth. No blood Neck:  No JVD, no thyromegaly, no mass Lungs:  Clear bilaterally. No shortness of breath or cough Heart: regular rate rhythm. No murmurs rubs gallops. S1, S2 audible Abdomen:  Soft, thin, no masses. Bowel sounds active. No distention. No tenderness.   Rectal: deferred   Musc/Skeltl: no joint deformity, swelling. Range of motion not assayed Extremities:  No pedal edema. Feet are warm  Neurologic:  Oriented 3. No limb weakness. No tremor. No gross deficits Skin:  No rashes or sores Tattoos:  None seen Nodes:  No cervical adenopathy   Psych:  Pleasant, relaxed. Cooperative and engaged  Intake/Output from previous day:   Intake/Output this shift:    LAB RESULTS:  Recent Labs  02/21/14 1140 02/21/14 2023 02/22/14 0534  WBC 4.7 5.5 5.2  HGB 11.2* 10.8* 10.0*  HCT 33.5* 32.9* 30.7*  PLT 255 261 248   BMET Lab Results  Component Value Date   NA 144 02/22/2014   NA 141  02/21/2014   NA 143 02/06/2014   K 4.1 02/22/2014   K 4.2 02/21/2014   K 3.8 02/06/2014   CL 110 02/22/2014   CL 107 02/21/2014   CL 106 02/06/2014   CO2 25 02/22/2014   CO2 26 02/21/2014   CO2 27 02/06/2014   GLUCOSE 74 02/22/2014   GLUCOSE 100* 02/21/2014   GLUCOSE 110* 02/06/2014   BUN 13 02/22/2014   BUN 19 02/21/2014   BUN 16 02/06/2014   CREATININE 0.96 02/22/2014   CREATININE 0.90 02/21/2014   CREATININE 1.00 02/06/2014   CALCIUM 8.2* 02/22/2014   CALCIUM 8.9 02/21/2014   CALCIUM 9.2 02/06/2014   LFT  Recent Labs  02/21/14 1140 02/22/14 0534  PROT 6.4 5.3*  ALBUMIN 3.5 3.0*  AST 20 14  ALT 17 13  ALKPHOS 71 57  BILITOT <0.2* 0.5   PT/INR Lab Results  Component Value Date   INR 1.14 02/21/2014   INR 1.14 02/21/2013     RADIOLOGY STUDIES: No results found.    IMPRESSION:   *  Chronic hematochezia.  Extensive work up by DR toledo with findings of SB and colonic AVMs, these have never been treated. Also hx of colon adenoma and gastritis. On 81 ASA but no PPI  *  ABL anemia.  On po Iron at home    PLAN:     *  Enteroscopy and colonoscopy. Set up for tomorrow at 11:00. Patient can start clears now and will begin split dose movi prep tonight.   *  I discontinued the Protonix. Although he does have a history of mild gastritis and is not taking acid  suppressing medication at home, the presentation of acute on chronic hematochezia is not consistent with ulcer disease.  Depending on findings on enteroscopy we can start oral PPI if indicated after tomorrow. Also limited CBC from every 8 hours to once daily so he will have another check tomorrow morning.  Discontinued telemetry as well.   Azucena Freed  02/22/2014, 10:16 AM Pager: [phone]

## 2014-02-21 NOTE — ED Notes (Signed)
Protonix has infused.

## 2014-02-21 NOTE — ED Provider Notes (Signed)
TIME SEEN: 11:15 AM  CHIEF COMPLAINT: bloody stool  HPI: Patient is a 68 y.o. M with history of hypertension, prior GI bleed requiring transfusion, atrial fibrillation, SVT, ventricular tachycardia? Without a defibrillator reports that he is on metoprolol who presents to the emergency department with 3 weeks of hematochezia. He states today he had an episode of maroon colored blood and what appeared to be melena without a bowel movement. He has not had any abdominal pain. No chest pain. He does have chronic shortness of breath secondary to wheezing from smoking. No lightheadedness or syncope. No hematemesis. He is on aspirin but no other anticoagulation.  Pt reports [place] [facility] [facility] but he is [place] to reestablish care in [place] and no longer wants to see this physician. He states he has had an endoscopy but never had a colonoscopy.  Denies heavy alcohol or NSAID use.  ROS: See HPI Constitutional: no fever  Eyes: no drainage  ENT: no runny nose   Cardiovascular:  no chest pain  Resp: no SOB  GI: no vomiting GU: no dysuria Integumentary: no rash  Allergy: no hives  Musculoskeletal: no leg swelling  Neurological: no slurred speech ROS otherwise negative  PAST MEDICAL HISTORY/PAST SURGICAL HISTORY:  Past Medical History  Diagnosis Date  . Chest pain 02/2013    CP in left anterior and left lateral chest. Described as pressure-like & sharp. Pain is moderate & worsening. Sx exacerbated by lying down and spicy foods. Has associated heartburn, nausea & weakness.  . GI bleed   . Carotid bruit   . Benign hypertension   . Syncope and collapse   . Tobacco abuse   . DOE (dyspnea on exertion)   . Acid reflux   . Dizziness and giddiness   . Carotid bruit   . Palpitations   . GI bleed   . SOB (shortness of breath)   . High risk medication use   . Atrial fibrillation     01-2013 - identified on holter monitor, RVR with abberration  . SVT (supraventricular  tachycardia)     Sx include palpitations, chest pain and near syncope. Pain located in substernal area.  . Ventricular tachycardia     ECHO 02/15/13 - EF between 55-60%  . Enlarged prostate     MEDICATIONS:  Prior to Admission medications   Medication Sig Start Date End Date Taking? Authorizing Provider  amLODipine-olmesartan (AZOR) 10-40 MG per tablet Take 1 tablet by mouth daily.   Yes Historical Provider, MD  aspirin 81 MG chewable tablet Chew 162 mg by mouth once. 02/21/13  Yes Luke K Kilroy, PA-C  finasteride (PROSCAR) 5 MG tablet Take 5 mg by mouth daily as needed.    Yes Historical Provider, MD  Multiple Vitamin (MULTIVITAMIN WITH MINERALS) TABS tablet Take 1 tablet by mouth daily.   Yes Historical Provider, MD  olmesartan (BENICAR) 40 MG tablet Take 40 mg by mouth daily.   Yes Historical Provider, MD  tamsulosin (FLOMAX) 0.4 MG CAPS capsule Take 0.4 mg by mouth daily.   Yes Historical Provider, MD    ALLERGIES:  No Known Allergies  SOCIAL HISTORY:  History  Substance Use Topics  . Smoking status: Current Every Day Smoker -- 0.50 packs/day for 40 years    Types: Cigarettes  . Smokeless tobacco: Not on file  . Alcohol Use: Yes     Comment: occasional, drinks beer    FAMILY HISTORY: Family History  Problem Relation Age of Onset  . Hyperlipidemia Mother   .  Hyperlipidemia Father   . Hyperlipidemia Brother   . Hypertension Mother   . Hypertension Father   . Hypertension Sister   . Hypertension Brother     EXAM: BP 123/70 mmHg  Pulse 54  Temp(Src) 97.8 F (36.6 C) (Oral)  Resp 18  Ht 5\' 7"  (1.702 m)  Wt 125 lb (56.7 kg)  BMI 19.57 kg/m2  SpO2 100% CONSTITUTIONAL: Alert and oriented and responds appropriately to questions. Well-appearing; well-nourished HEAD: Normocephalic EYES: Conjunctivae clear, PERRL ENT: normal nose; no rhinorrhea; moist mucous membranes; pharynx without lesions noted NECK: Supple, no meningismus, no LAD  CARD: regular and bradycardic;  S1 and S2 appreciated; no murmurs, no clicks, no rubs, no gallops RESP: Normal chest excursion without splinting or tachypnea; breath sounds clear and equal bilaterally; no wheezes, no rhonchi, no rales,  ABD/GI: Normal bowel sounds; non-distended; soft, non-tender, no rebound, no guarding RECTAL:  Gross maroon blood with some melena, no rectal pain, normal rectal tone, small external nonthrombosed hemorrhoids BACK:  The back appears normal and is non-tender to palpation, there is no CVA tenderness EXT: Normal ROM in all joints; non-tender to palpation; no edema; normal capillary refill; no cyanosis    SKIN: Normal color for age and race; warm NEURO: Moves all extremities equally PSYCH: The patient's mood and manner are appropriate. Grooming and personal hygiene are appropriate.  MEDICAL DECISION MAKING: Pt here with a GI bleed. Concern for possible diverticular bleed given the large amount of blood seen on rectal exam. He is intermittently bradycardic and reports he is on a beta blocker. He is not hypotensive. Will obtain labs. Anticipate discussing with gastroenterology and admitting to the hospital. We'll keep patient nothing by mouth. His abdominal exam is benign.  ED PROGRESS: Labs show a hemoglobin of 11.2. Otherwise his labs are unremarkable. No thrombocytopenia or coagulopathy. We'll discuss with gastroenterology and medicine. He does have intermittent episodes were his heart rate will drop into the upper 30s but does not last for more than several seconds. He is asymptomatic during these episodes. Reports his normal heart rate is in the 40s. I do not feel this time he needs glucagon but will closely monitor. EKG does not show any interval changes. He is not on digoxin.  Electrolytes within normal limits.   1:56 PM  Spoke with Dr. Rhea BeltonPyrtle with GI for consult for GI bleed.  [facility] Dr. Benjamine MolaVann.   EKG Interpretation  Date/Time:  Wednesday February 21 2014 13:06:07  EST Ventricular Rate:  44 PR Interval:  118 QRS Duration: 68 QT Interval:  452 QTC Calculation: 386 R Axis:   98 Text Interpretation:  Marked sinus bradycardia Rightward axis Septal infarct , age undetermined Abnormal ECG Confirmed by WARD,  DO, KRISTEN [phone]([id number]) on 02/21/2014 1:09:35 PM        Layla MawKristen N Ward, DO 02/21/14 1535

## 2014-02-21 NOTE — H&P (Signed)
Hospitalist Admission History and Physical  Patient name: Tyler Hunt Medical record number: [id number] Date of birth: 07/23/1945 Age: 68 y.o. Gender: male  Primary Care Provider: Rocky MorelOSTAND, ROBERT, MD  Chief Complaint: GIB, sinus bradycardia  History of Present Illness:This is a 68 y.o. year old male with significant past medical history of GI bleeding, sinus bradycardia, hx/o SVT, GERD, HTN, tobacco abuse presenting with GIB, sinus bradycardia. Pt states that he has noticed intermittent maroon colored stools over past 1-2 weeks. Had episode of BRBPR earlier today. States that he had a similar episode 2 years ago. Was hospitalized [place] [facility] for this. Per pt, he had an endoscopy that showed ? Gastric disease. However, he did not have a colonoscopy done. Pt states that he has never had colonoscopy in the past. Denies any recent NSAID use apart from daily baby ASA. + occasional ETOH. Daily smoker. Mild lower abd pain.  [facility]/  98.3, HR 40s-50s, BP 110s-130s. Hgb 11.2. Hemoccult +. EKG shows marked sinus bradycardia.  Pt states that he was evaluated 1-2 years ago for palpitations/ SVT. Was put on "medication" for this. States that he has had slow heart rate since this point. Currently denies any CP, SOB, weakness, dizziness.  Assessment and Plan: Tyler Hunt is a 68 y.o. year old male presenting with GIB, sinus bradycardia    Active Problems:   GI bleed   Sinus bradycardia   1- GIB  -GI consult (pending) -high dose PPI  -serial CBCs -hgb appears to be fairly stable currently (hgb 10-11 over past 2 weeks)  -type and screen  -hold offending agent s -f/u GI recs  2- Sinus bradycardia -? Hx/o afib, SVT in the past   -HR in 30s-40s  -sinus bradycardia on EKG  -not on anticoagulation  -currently asymptomatic  -subacute on chronic issue  -Suspect medication induced in setting of toprol use -HOLD  -follow closely  -consider atropine + cards consult if pt becomes  symptomatic   3- HTN -BP fairly stable currently  -HOLD BP meds overnight in setting of above  -follow   4- Tobacco abuse  -tobacco abuse counseling  FEN/GI: NPO  Prophylaxis: SCDs  Disposition: pending further evaluation  Code Status:Full Code    Patient Active Problem List   Diagnosis Date Noted  . GI bleed 02/21/2014  . Sinus bradycardia 02/21/2014  . Angina at rest 02/21/2013  . HTN (hypertension) 02/21/2013  . Tobacco abuse 02/21/2013  . H/O syncope- 01/2013 02/21/2013  . H/O ventricular tachycardia -01/2013 02/21/2013   Past Medical History: Past Medical History  Diagnosis Date  . Chest pain 02/2013    CP in left anterior and left lateral chest. Described as pressure-like & sharp. Pain is moderate & worsening. Sx exacerbated by lying down and spicy foods. Has associated heartburn, nausea & weakness.  . Carotid bruit   . Benign hypertension   . Syncope and collapse 02/2013    felt to be vasovagal.   . Tobacco abuse   . DOE (dyspnea on exertion)   . Acid reflux   . Carotid bruit   . Palpitations   . GI bleed   . SOB (shortness of breath)   . Atrial fibrillation     01-2013 - identified on holter monitor, RVR with abberration  . SVT (supraventricular tachycardia)     Sx include palpitations, chest pain and near syncope. Pain located in substernal area.  . Ventricular tachycardia     ECHO 02/15/13 - EF between 55-60%  .  Enlarged prostate     Past Surgical History: History reviewed. No pertinent past surgical history.  Social History: History   Social History  . Marital Status: Single    Spouse Name: N/A    Number of Children: N/A  . Years of Education: N/A   Social History Main Topics  . Smoking status: Current Every Day Smoker -- 0.50 packs/day for 40 years    Types: Cigarettes  . Smokeless tobacco: None  . Alcohol Use: Yes     Comment: occasional, drinks beer  . Drug Use: None  . Sexual Activity: None   Other Topics Concern  . None    Social History Narrative    Family History: Family History  Problem Relation Age of Onset  . Hyperlipidemia Mother   . Hyperlipidemia Father   . Hyperlipidemia Brother   . Hypertension Mother   . Hypertension Father   . Hypertension Sister   . Hypertension Brother     Allergies: No Known Allergies  Current Facility-Administered Medications  Medication Dose Route Frequency Provider Last Rate Last Dose  . 0.9 %  sodium chloride infusion   Intravenous Continuous Doree AlbeeSteven Yaminah Clayborn, MD      . albuterol-ipratropium (COMBIVENT) inhaler 1 puff  1 puff Inhalation Q4H Doree AlbeeSteven Kymia Simi, MD      . finasteride (PROSCAR) tablet 5 mg  5 mg Oral Daily Doree AlbeeSteven Taylyn Brame, MD      . multivitamin with minerals tablet 1 tablet  1 tablet Oral Daily Doree AlbeeSteven Devery Murgia, MD      . pantoprazole (PROTONIX) 80 mg in sodium chloride 0.9 % 100 mL IVPB  80 mg Intravenous Once Kristen N Ward, DO      . pantoprazole (PROTONIX) injection 40 mg  40 mg Intravenous Q12H Doree AlbeeSteven Elishua Radford, MD       Review Of Systems: 12 point ROS negative except as noted above in HPI.  Physical Exam: Filed Vitals:   02/21/14 1842  BP: 133/60  Pulse: 44  Temp: 98.3 F (36.8 C)  Resp: 16    General: alert and cooperative HEENT: PERRLA and extra ocular movement intact Heart: S1, S2 normal, no murmur, rub or gallop, regular rate and rhythm Lungs: clear to auscultation, no wheezes or rales and unlabored breathing Abdomen: abdomen is soft without significant tenderness, masses, organomegaly or guarding Extremities: extremities normal, atraumatic, no cyanosis or edema Skin:no rashes, no ecchymoses Neurology: normal without focal findings  Labs and Imaging: Lab Results  Component Value Date/Time   NA 141 02/21/2014 11:40 AM   K 4.2 02/21/2014 11:40 AM   CL 107 02/21/2014 11:40 AM   CO2 26 02/21/2014 11:40 AM   BUN 19 02/21/2014 11:40 AM   CREATININE 0.90 02/21/2014 11:40 AM   GLUCOSE 100* 02/21/2014 11:40 AM   Lab Results  Component  Value Date   WBC 4.7 02/21/2014   HGB 11.2* 02/21/2014   HCT 33.5* 02/21/2014   MCV 97.1 02/21/2014   PLT 255 02/21/2014    No results found.         Doree AlbeeSteven Dyllen Menning MD  Pager: [phone]

## 2014-02-21 NOTE — ED Notes (Signed)
Report given to JC RN

## 2014-02-21 NOTE — ED Notes (Signed)
C/o passing blood in stools and when he had bm this am he states BM was bright red to dark. C/o low abd pain. Pt states sx longer than 3 months. Has specialist and had test for same.

## 2014-02-21 NOTE — Progress Notes (Signed)
NURSING PROGRESS NOTE  Tyler Hunt [id number] Admission Data: 02/21/2014 7:33 PM Attending Provider: Joseph ArtJessica U Vann, DO OZH:YQMVHQIPCP:ROSTAND, ROBERT, MD Code Status: FULL  Tyler Hunt is a 68 y.o. male patient admitted from ED:  -No acute distress noted.  -No complaints of shortness of breath.  -No complaints of chest pain.    Blood pressure 133/60, pulse 44, temperature 98.3 F (36.8 C), temperature source Oral, resp. rate 16, height 5\' 7"  (1.702 m), weight 56.7 kg (125 lb), SpO2 100 %.   IV Fluids:  IV in place, occlusive dsg intact without redness, IV cath antecubital left, condition patent and no redness normal saline.   Allergies:  Review of patient's allergies indicates no known allergies.  Past Medical History:   has a past medical history of Chest pain (02/2013); Carotid bruit; Benign hypertension; Syncope and collapse (02/2013); Tobacco abuse; DOE (dyspnea on exertion); Acid reflux; Carotid bruit; Palpitations; GI bleed; SOB (shortness of breath); Atrial fibrillation; SVT (supraventricular tachycardia); Ventricular tachycardia; and Enlarged prostate.  Past Surgical History:   has no past surgical history on file.  Social History:   reports that he has been smoking Cigarettes.  He has a 20 pack-year smoking history. He does not have any smokeless tobacco history on file. He reports that he drinks alcohol.  Skin: intact  Patient/Family orientated to room. Information packet given to patient/family. Admission inpatient armband information verified with patient/family to include name and date of birth and placed on patient arm. Side rails up x 2, fall assessment and education completed with patient/family. Patient/family able to verbalize understanding of risk associated with falls and verbalized understanding to call for assistance before getting out of bed. Call light within reach. Patient/family able to voice and demonstrate understanding of unit orientation instructions.    Will  continue to evaluate and treat per MD orders.  Cathlyn Parsonsattha Ever Gustafson, RN

## 2014-02-21 NOTE — ED Notes (Signed)
Attempt to call report to 5 west.

## 2014-02-21 NOTE — Progress Notes (Signed)
NURSING PROGRESS NOTE  Tyler MuslimJames Ellegood [id number] Admission Data: 02/21/2014 7:47 PM Attending Provider: Joseph ArtJessica U Vann, DO WUJ:[id number]:ROSTAND, ROBERT, MD Code Status: FULL  Tyler Hunt is a 68 y.o. male patient admitted from ED:  -No acute distress noted.  -No complaints of shortness of breath.  -No complaints of chest pain.   Cardiac Monitoring: Box # 4 in place.   Blood pressure 133/60, pulse 44, temperature 98.3 F (36.8 C), temperature source Oral, resp. rate 16, height 5\' 7"  (1.702 m), weight 56.7 kg (125 lb), SpO2 100 %.   IV Fluids:  IV in place, occlusive dsg intact without redness, IV cath antecubital left, condition patent and no redness normal saline.   Allergies:  Review of patient's allergies indicates no known allergies.  Past Medical History:   has a past medical history of Chest pain (02/2013); Carotid bruit; Benign hypertension; Syncope and collapse (02/2013); Tobacco abuse; DOE (dyspnea on exertion); Acid reflux; Carotid bruit; Palpitations; GI bleed; SOB (shortness of breath); Atrial fibrillation; SVT (supraventricular tachycardia); Ventricular tachycardia; and Enlarged prostate.  Past Surgical History:   has no past surgical history on file.   Skin: intact  Patient/Family orientated to room. Information packet given to patient/family. Admission inpatient armband information verified with patient/family to include name and date of birth and placed on patient arm. Side rails up x 2, fall assessment and education completed with patient/family. Patient/family able to verbalize understanding of risk associated with falls and verbalized understanding to call for assistance before getting out of bed. Call light within reach. Patient/family able to voice and demonstrate understanding of unit orientation instructions.    Will continue to evaluate and treat per MD orders.  Cathlyn Parsonsattha Licet Dunphy, RN

## 2014-02-21 NOTE — ED Notes (Signed)
[facility].

## 2014-02-21 NOTE — ED Notes (Addendum)
Dr Elesa MassedWard aware of low hrt rate (as low as 38). No changes in assessment. Ekg ordered.

## 2014-02-22 ENCOUNTER — Encounter (HOSPITAL_COMMUNITY): Payer: Self-pay | Admitting: Physician Assistant

## 2014-02-22 DIAGNOSIS — F17210 Nicotine dependence, cigarettes, uncomplicated: Secondary | ICD-10-CM | POA: Diagnosis present

## 2014-02-22 DIAGNOSIS — K449 Diaphragmatic hernia without obstruction or gangrene: Secondary | ICD-10-CM | POA: Diagnosis present

## 2014-02-22 DIAGNOSIS — I4891 Unspecified atrial fibrillation: Secondary | ICD-10-CM | POA: Diagnosis present

## 2014-02-22 DIAGNOSIS — Z7982 Long term (current) use of aspirin: Secondary | ICD-10-CM | POA: Diagnosis not present

## 2014-02-22 DIAGNOSIS — K2980 Duodenitis without bleeding: Secondary | ICD-10-CM | POA: Diagnosis present

## 2014-02-22 DIAGNOSIS — K219 Gastro-esophageal reflux disease without esophagitis: Secondary | ICD-10-CM | POA: Diagnosis present

## 2014-02-22 DIAGNOSIS — I10 Essential (primary) hypertension: Secondary | ICD-10-CM | POA: Diagnosis present

## 2014-02-22 DIAGNOSIS — R001 Bradycardia, unspecified: Secondary | ICD-10-CM | POA: Diagnosis present

## 2014-02-22 DIAGNOSIS — K5521 Angiodysplasia of colon with hemorrhage: Secondary | ICD-10-CM | POA: Diagnosis present

## 2014-02-22 DIAGNOSIS — D62 Acute posthemorrhagic anemia: Secondary | ICD-10-CM | POA: Diagnosis present

## 2014-02-22 DIAGNOSIS — K5520 Angiodysplasia of colon without hemorrhage: Secondary | ICD-10-CM

## 2014-02-22 DIAGNOSIS — K2960 Other gastritis without bleeding: Secondary | ICD-10-CM | POA: Diagnosis present

## 2014-02-22 LAB — CBC
HEMATOCRIT: 30.7 % — AB (ref 39.0–52.0)
Hemoglobin: 10 g/dL — ABNORMAL LOW (ref 13.0–17.0)
MCH: 31.3 pg (ref 26.0–34.0)
MCHC: 32.6 g/dL (ref 30.0–36.0)
MCV: 96.2 fL (ref 78.0–100.0)
Platelets: 248 10*3/uL (ref 150–400)
RBC: 3.19 MIL/uL — ABNORMAL LOW (ref 4.22–5.81)
RDW: 13.6 % (ref 11.5–15.5)
WBC: 5.2 10*3/uL (ref 4.0–10.5)

## 2014-02-22 LAB — COMPREHENSIVE METABOLIC PANEL
ALBUMIN: 3 g/dL — AB (ref 3.5–5.2)
ALK PHOS: 57 U/L (ref 39–117)
ALT: 13 U/L (ref 0–53)
AST: 14 U/L (ref 0–37)
Anion gap: 9 (ref 5–15)
BUN: 13 mg/dL (ref 6–23)
CALCIUM: 8.2 mg/dL — AB (ref 8.4–10.5)
CO2: 25 mEq/L (ref 19–32)
Chloride: 110 mEq/L (ref 96–112)
Creatinine, Ser: 0.96 mg/dL (ref 0.50–1.35)
GFR calc non Af Amer: 85 mL/min — ABNORMAL LOW (ref >90)
Glucose, Bld: 74 mg/dL (ref 70–99)
POTASSIUM: 4.1 meq/L (ref 3.7–5.3)
SODIUM: 144 meq/L (ref 137–147)
Total Bilirubin: 0.5 mg/dL (ref 0.3–1.2)
Total Protein: 5.3 g/dL — ABNORMAL LOW (ref 6.0–8.3)

## 2014-02-22 MED ORDER — SODIUM CHLORIDE 0.9 % IV SOLN
INTRAVENOUS | Status: DC
  Administered 2014-02-23: 11:00:00 via INTRAVENOUS

## 2014-02-22 MED ORDER — PEG-KCL-NACL-NASULF-NA ASC-C 100 G PO SOLR
0.5000 | Freq: Once | ORAL | Status: AC
  Administered 2014-02-22: 100 g via ORAL
  Filled 2014-02-22 (×2): qty 1

## 2014-02-22 MED ORDER — BISACODYL 5 MG PO TBEC
5.0000 mg | DELAYED_RELEASE_TABLET | Freq: Once | ORAL | Status: AC
  Administered 2014-02-22: 5 mg via ORAL
  Filled 2014-02-22: qty 1

## 2014-02-22 MED ORDER — PEG-KCL-NACL-NASULF-NA ASC-C 100 G PO SOLR: 1.0000 | Freq: Once | ORAL | Status: DC

## 2014-02-22 MED ORDER — PEG-KCL-NACL-NASULF-NA ASC-C 100 G PO SOLR
0.5000 | Freq: Once | ORAL | Status: AC
  Administered 2014-02-23: 100 g via ORAL

## 2014-02-22 MED ORDER — OXYCODONE-ACETAMINOPHEN 5-325 MG PO TABS
2.0000 | ORAL_TABLET | Freq: Once | ORAL | Status: AC
  Administered 2014-02-22: 2 via ORAL
  Filled 2014-02-22: qty 2

## 2014-02-22 NOTE — Progress Notes (Addendum)
PROGRESS NOTE  Elmar Antigua DHR:[id number] DOB: 1945-12-24 DOA: 02/21/2014 PCP: Velna Ochs, MD  HPI/Subjective: 68 yo male with PMH of GI bleeding requiring transfusion, sinus bradycardia,SVT, GERD, HTN, and tobacco abuse.  Pt presented to the ED with complaints of GI bleed.  Intermittent maroon colored stools over past 2 weeks.  With an episode of BRBPR on 11/04.  Similar episode 1-2 years ago which he received an endoscopy which showed ?gastic disease.  Pt has never received colonoscopy.  Denies any recent NSAID use apart from daily baby ASA. + occasional ETOH. Daily smoker. Mild lower abd pain. Presented to [facility] w/ 98.3, HR 40s-50s, BP 110s-130s. Hgb 11.2. Hemoccult +. EKG shows marked sinus bradycardia. Pt states that he was evaluated 1-2 years ago for palpitations/ SVT. Was put on "medication" for this. States that he has had slow heart rate since this point. Currently denies any CP, SOB, weakness, dizziness.      No BM today.  Complains of burning pain in stomach, occasional lightheadedness.  Denies any recent wt loss, hematemesis, or fatigue.       Assessment/Plan:  Hematochezia: Possible diverticular bleed but rule out colorectal cancer.  No BM today.  Hx of colon adenoma and gastritis.  H/H stable 10.0/30.7.   GI plans to perform enteroscopy and colonoscopy on 11/06.   Pt to remain on clears today and start split dose Moviprep tonight.               Sinus Bradycardia: Chronic problem.  Asymptomatic.  HR somewhat improved since admission with d/c of metoprolol, may not need this at all on discharge HR in the 40-50's.  Sinus bradycardia on EKG.  - history of chest pain and intermittent syncopal episodes, underwent cardiology evaluation and a cardiac cath 02/2013 which showed normal coronary arteries and normal LV function. No chest pain or symptoms now.  Hypertension: Chronic problem.  BP stable.  Holding Metoprolol and Olmesartan.  Continue to monitor BP.   Tobacco abuse -  counseled for cessation  DVT Prophylaxis:  SCDs  Code Status: Full Family Communication: Pt is awake and alert.  Disposition Plan: Will return home when medically appropriate.     Consultants:  GI  Procedures:  Enteroscopy 11/06  Colonoscopy 11/06  Antibiotics:  None  Objective: Filed Vitals:   02/21/14 2125 02/22/14 0534 02/22/14 0535 02/22/14 1015  BP: 108/61 114/56 114/56 118/55  Pulse: 46 42 42 43  Temp: 98.1 F (36.7 C) 98.6 F (37 C) 98.6 F (37 C) 98.6 F (37 C)  TempSrc: Oral Oral Oral Oral  Resp: 18 18 18 18   Height:      Weight:      SpO2: 100% 98% 98% 98%   No intake or output data in the 24 hours ending 02/22/14 1101 Filed Weights   02/21/14 1035  Weight: 56.7 kg (125 lb)    Exam: General: Thin male, appears stated age, NAD  HEENT:  EOMI, Anicteic Sclera, MMM. Neck: Supple, no JVD, no masses  Cardiovascular: RRR, S1 S2 auscultated, no rubs, murmurs or gallops.   Respiratory: Clear to auscultation bilaterally with equal chest rise  Abdomen: Soft, nontender, nondistended, + bowel sounds  Extremities: warm dry without cyanosis clubbing or edema.  Neuro: AAOx3, cranial nerves grossly intact.  Skin: Without rashes exudates or nodules.     Data Reviewed: Basic Metabolic Panel:  Recent Labs Lab 02/21/14 1140 02/22/14 0534  NA 141 144  K 4.2 4.1  CL 107 110  CO2 26 25  GLUCOSE 100* 74  BUN 19 13  CREATININE 0.90 0.96  CALCIUM 8.9 8.2*   Liver Function Tests:  Recent Labs Lab 02/21/14 1140 02/22/14 0534  AST 20 14  ALT 17 13  ALKPHOS 71 57  BILITOT <0.2* 0.5  PROT 6.4 5.3*  ALBUMIN 3.5 3.0*   CBC:  Recent Labs Lab 02/21/14 1140 02/21/14 2023 02/22/14 0534  WBC 4.7 5.5 5.2  NEUTROABS 3.1  --   --   HGB 11.2* 10.8* 10.0*  HCT 33.5* 32.9* 30.7*  MCV 97.1 95.6 96.2  PLT 255 261 248    Studies: No results found.  Scheduled Meds: . antiseptic oral rinse  7 mL Mouth Rinse q12n4p  . bisacodyl  5 mg Oral Once  .  chlorhexidine  15 mL Mouth Rinse BID  . finasteride  5 mg Oral Daily  . multivitamin with minerals  1 tablet Oral Daily  . peg 3350 powder  0.5 kit Oral Once   And  . [START ON 02/23/2014] peg 3350 powder  0.5 kit Oral Once  . pneumococcal 23 valent vaccine  0.5 mL Intramuscular Tomorrow-1000   Continuous Infusions: . sodium chloride 50 mL/hr at 02/22/14 1038    Active Problems:   GI bleed   Sinus bradycardia    Adelene Idler PA-S  [facility] Pager [phone]. If 7PM-7AM, please contact night-coverage at [email or web address], password [facility] 02/22/2014, 11:01 AM  LOS: 1 day       Patient seen and examined, chart and data base reviewed, A/P reviewed and note edited  68 yo M here with GI bleed, somewhat acute on chronic. GI was consulted and appears that he will undergo colonoscopy tomorrow.   No family history of colon cancer, no weight loss   Hemoglobin stable, he does not need transfusions  Continue telemetry for 24 hours for bradycardia. Asymptomatic   Marzetta Board, MD [facility] [phone]

## 2014-02-22 NOTE — Progress Notes (Signed)
UR completed 

## 2014-02-22 NOTE — Progress Notes (Signed)
Notified Schorr, NP that pt requesting pain medication for his abd. Pt rating pain a 5 that is cramping. Pt states he has had 1 more bloody stool. Schorr, NP stated she will order pain medication. Will continue to monitor pt. Nelda MarseilleJenny Thacker, RN

## 2014-02-22 NOTE — Progress Notes (Signed)
Notified Schorr, NP via text page that pt receiving moviprep for colonoscopy in the morning. Pt having bloody stool that is mostly blood with small amount of stool. Will continue to monitor pt. Nelda MarseilleJenny Thacker, RN

## 2014-02-23 ENCOUNTER — Encounter (HOSPITAL_COMMUNITY)
Admission: EM | Disposition: A | Discharge status: Home / Self Care | Payer: Self-pay | Source: Home / Self Care | Attending: Internal Medicine

## 2014-02-23 ENCOUNTER — Inpatient Hospital Stay (HOSPITAL_COMMUNITY): Payer: Medicare HMO | Admitting: Anesthesiology

## 2014-02-23 ENCOUNTER — Encounter (HOSPITAL_COMMUNITY): Payer: Self-pay | Admitting: Internal Medicine

## 2014-02-23 DIAGNOSIS — K5520 Angiodysplasia of colon without hemorrhage: Secondary | ICD-10-CM

## 2014-02-23 DIAGNOSIS — K922 Gastrointestinal hemorrhage, unspecified: Secondary | ICD-10-CM

## 2014-02-23 DIAGNOSIS — K639 Disease of intestine, unspecified: Secondary | ICD-10-CM

## 2014-02-23 DIAGNOSIS — K2970 Gastritis, unspecified, without bleeding: Secondary | ICD-10-CM

## 2014-02-23 HISTORY — PX: ENTEROSCOPY: SHX5533

## 2014-02-23 HISTORY — PX: COLONOSCOPY: SHX5424

## 2014-02-23 LAB — COMPREHENSIVE METABOLIC PANEL
ALT: 14 U/L (ref 0–53)
AST: 15 U/L (ref 0–37)
Albumin: 3.3 g/dL — ABNORMAL LOW (ref 3.5–5.2)
Alkaline Phosphatase: 59 U/L (ref 39–117)
Anion gap: 10 (ref 5–15)
BUN: 14 mg/dL (ref 6–23)
CALCIUM: 8.5 mg/dL (ref 8.4–10.5)
CO2: 25 meq/L (ref 19–32)
Chloride: 110 mEq/L (ref 96–112)
Creatinine, Ser: 1.12 mg/dL (ref 0.50–1.35)
GFR calc Af Amer: 78 mL/min — ABNORMAL LOW (ref >90)
GFR calc non Af Amer: 67 mL/min — ABNORMAL LOW (ref >90)
GLUCOSE: 80 mg/dL (ref 70–99)
Potassium: 4.2 mEq/L (ref 3.7–5.3)
Sodium: 145 mEq/L (ref 137–147)
Total Bilirubin: 0.4 mg/dL (ref 0.3–1.2)
Total Protein: 5.8 g/dL — ABNORMAL LOW (ref 6.0–8.3)

## 2014-02-23 LAB — CBC
HCT: 29.3 % — ABNORMAL LOW (ref 39.0–52.0)
Hemoglobin: 9.7 g/dL — ABNORMAL LOW (ref 13.0–17.0)
MCH: 32 pg (ref 26.0–34.0)
MCHC: 33.1 g/dL (ref 30.0–36.0)
MCV: 96.7 fL (ref 78.0–100.0)
PLATELETS: 256 10*3/uL (ref 150–400)
RBC: 3.03 MIL/uL — AB (ref 4.22–5.81)
RDW: 13.5 % (ref 11.5–15.5)
WBC: 5.6 10*3/uL (ref 4.0–10.5)

## 2014-02-23 SURGERY — COLONOSCOPY
Anesthesia: Monitor Anesthesia Care

## 2014-02-23 MED ORDER — BUTAMBEN-TETRACAINE-BENZOCAINE 2-2-14 % EX AERO
INHALATION_SPRAY | CUTANEOUS | Status: DC | PRN
  Administered 2014-02-23: 2 via TOPICAL

## 2014-02-23 MED ORDER — PROPOFOL INFUSION 10 MG/ML OPTIME
INTRAVENOUS | Status: DC | PRN
  Administered 2014-02-23: 100 ug/kg/min via INTRAVENOUS

## 2014-02-23 MED ORDER — PHENYLEPHRINE HCL 10 MG/ML IJ SOLN
INTRAMUSCULAR | Status: DC | PRN
  Administered 2014-02-23: 80 ug via INTRAVENOUS

## 2014-02-23 MED ORDER — EPHEDRINE SULFATE 50 MG/ML IJ SOLN
INTRAMUSCULAR | Status: DC | PRN
  Administered 2014-02-23: 10 mg via INTRAVENOUS
  Administered 2014-02-23: 5 mg via INTRAVENOUS

## 2014-02-23 MED ORDER — PANTOPRAZOLE SODIUM 40 MG PO TBEC
40.0000 mg | DELAYED_RELEASE_TABLET | Freq: Every day | ORAL | Status: DC
  Administered 2014-02-24: 40 mg via ORAL
  Filled 2014-02-23: qty 1

## 2014-02-23 MED ORDER — SODIUM CHLORIDE 0.9 % IV SOLN
INTRAVENOUS | Status: DC | PRN
Start: 2014-02-23 — End: 2014-02-23
  Administered 2014-02-23 (×2): via INTRAVENOUS

## 2014-02-23 NOTE — Op Note (Signed)
[facility] [address] [place], [id number]   COLONOSCOPY PROCEDURE REPORT  PATIENT: Tyler Hunt, Tyler Hunt  MR#: [id number] BIRTHDATE: [birth year] , 67  yrs. old GENDER: male ENDOSCOPIST: Beverley FiedlerJay M Pyrtle, MD REFERRED ZH:[id number]:[facility] PROCEDURE DATE:  02/23/2014 PROCEDURE:   Colonoscopy, diagnostic First Screening Colonoscopy - Avg.  risk and is 50 yrs.  old or older - No.  Prior Negative Screening - Now for repeat screening. N/A  History of Adenoma - Now for follow-up colonoscopy & has been > or = to 3 yrs.  N/A  Polyps Removed Today? No.  Polyps Removed Today? No.  Recommend repeat exam, <10 yrs? Polyps Removed Today? No.  Recommend repeat exam, <10 yrs? Yes.  Polyps Removed Today? No.  Recommend repeat exam, <10 yrs? Yes.  No reason given. ASA CLASS:   Class III INDICATIONS:hematochezia. MEDICATIONS: Per Anesthesia and Monitored anesthesia care  DESCRIPTION OF PROCEDURE:   After the risks benefits and alternatives of the procedure were thoroughly explained, informed consent was obtained.  The digital rectal exam revealed small external hemorrhoids and skin tags.   The Pentax Ped Colon [id number] endoscope was introduced through the anus and advanced to the cecum, which was identified by both the appendix and ileocecal valve. No adverse events experienced.   The quality of the prep was good, using MoviPrep  The instrument was then slowly withdrawn as the colon was fully examined.    COLON FINDINGS: A normal appearing cecum, ileocecal valve, and appendiceal orifice were identified.  The ascending, transverse, descending, sigmoid colon, and rectum appeared unremarkable. There was no evidence of colonic angiodysplasia, polyps or tumors. No diverticulosis was seen. Retroflexed views revealed skin tag. The scope was withdrawn and the procedure completed. COMPLICATIONS: There were no immediate complications.  ENDOSCOPIC IMPRESSION: Normal  colonoscopy  RECOMMENDATIONS: 1.  Closely monitor Hgb, replace iron if needed 2.  GI follow-up with patient's primary gastroenterologist  eSigned:  Beverley FiedlerJay M Pyrtle, MD 02/23/2014 1:19 PM   cc: The Patient

## 2014-02-23 NOTE — Anesthesia Preprocedure Evaluation (Addendum)
Anesthesia Evaluation  Patient identified by MRN, date of birth, ID band Patient awake    Reviewed: Allergy & Precautions, H&P , NPO status , Patient's Chart, lab work & pertinent test results, reviewed documented beta blocker date and time   History of Anesthesia Complications Negative for: history of anesthetic complications  Airway Mallampati: II  TM Distance: >3 FB Neck ROM: Full    Dental  (+) Poor Dentition, Loose, Missing, Dental Advisory Given, Chipped   Pulmonary COPD COPD inhaler, Current Smoker,  breath sounds clear to auscultation        Cardiovascular hypertension, Pt. on medications and Pt. on home beta blockers - angina+ dysrhythmias Atrial Fibrillation, Supra Ventricular Tachycardia and Ventricular Tachycardia Rhythm:Regular Rate:Bradycardia  11/15 cath: normal coronaries with normal LV function   Neuro/Psych Syncope with dysrhythmia    GI/Hepatic Neg liver ROS, GERD-  Controlled,  Endo/Other  negative endocrine ROS  Renal/GU negative Renal ROS     Musculoskeletal   Abdominal   Peds  Hematology  (+) Blood dyscrasia (Hb 9.7), ,   Anesthesia Other Findings   Reproductive/Obstetrics                          Anesthesia Physical Anesthesia Plan  ASA: III  Anesthesia Plan: MAC   Post-op Pain Management:    Induction: Intravenous  Airway Management Planned: Natural Airway and Nasal Cannula  Additional Equipment:   Intra-op Plan:   Post-operative Plan:   Informed Consent: I have reviewed the patients History and Physical, chart, labs and discussed the procedure including the risks, benefits and alternatives for the proposed anesthesia with the patient or authorized representative who has indicated his/her understanding and acceptance.   Dental advisory given  Plan Discussed with: CRNA and Surgeon  Anesthesia Plan Comments: (Plan routine monitors, MAC)         Anesthesia Quick Evaluation

## 2014-02-23 NOTE — Care Management Note (Unsigned)
    Page 1 of 1   02/23/2014     10:48:36 AM CARE MANAGEMENT NOTE 02/23/2014  Patient:  Tyler Hunt,Tyler Hunt   Account Number:  [id number]  Date Initiated:  02/23/2014  Documentation initiated by:  Letha CapeAYLOR,Jestine Bicknell  Subjective/Objective Assessment:   dx gib  admit- lives alone     Action/Plan:   11/6- colonscopy today.   Anticipated DC Date:  02/24/2014   Anticipated DC Plan:  HOME/SELF CARE      DC Planning Services  CM consult      Choice offered to / List presented to:             Status of service:  In process, will continue to follow Medicare Important Message given?  YES (If response is "NO", the following Medicare IM given date fields will be blank) Date Medicare IM given:  02/23/2014 Medicare IM given by:  Letha CapeAYLOR,Graciela Plato Date Additional Medicare IM given:   Additional Medicare IM given by:    Discharge Disposition:    Per UR Regulation:  Reviewed for med. necessity/level of care/duration of stay  If discussed at Long Length of Stay Meetings, dates discussed:    Comments:  02/23/14 1047 Letha Capeeborah Kaysha Parsell RN, [id number] [phone] pateint lives alone.  NCM will continue to follow for dc needs.

## 2014-02-23 NOTE — Progress Notes (Signed)
PROGRESS NOTE  Tyler Hunt ZOX:[id number] DOB: 10/02/1945 DOA: 02/21/2014 PCP: Rocky MorelOSTAND, ROBERT, MD  HPI/Subjective: 68 yo male with PMH of GI bleeding requiring transfusion, sinus bradycardia,SVT, GERD, HTN, and tobacco abuse.  Pt presented to the ED with complaints of GI bleed.  Intermittent maroon colored stools over past 2 weeks.  With an episode of BRBPR on 11/04.  Similar episode 1-2 years ago which he received an endoscopy which showed ?gastic disease.  Pt has never received colonoscopy.  Denies any recent NSAID use apart from daily baby ASA. + occasional ETOH. Daily smoker. Mild lower abd pain. [facility], HR 40s-50s, BP 110s-130s. Hgb 11.2. Hemoccult +. EKG shows marked sinus bradycardia. Pt states that he was evaluated 1-2 years ago for palpitations/ SVT. Was put on "medication" for this. States that he has had slow heart rate since this point. Currently denies any CP, SOB, weakness, dizziness.      BM overnight with dark red blood noted.  Complains of burning pain in stomach, occasional lightheadedness.  Denies any recent wt loss, hematemesis, or fatigue.      Assessment/Plan: Hematochezia:  Has a history of AVMs, GI to scope today  Moderate bleeding overnight with the prep, darker colored suspect residual blood in the GI tract GI to scope today  Sinus Bradycardia: Chronic problem.  Asymptomatic.  HR somewhat improved since admission with d/c of metoprolol, may not need this at all on discharge HR in the 40-50's.  Sinus bradycardia on EKG.  Hx of chest pain and intermittent syncopal episodes, underwent cardiology evaluation and a cardiac cath 02/2013 which showed normal coronary arteries and normal LV function. No chest pain or symptoms today.  Hypertension: Chronic problem.  BP stable.  Holding Metoprolol and Olmesartan.  Continue to monitor BP.   Tobacco abuse: Counseled for cessation  DVT Prophylaxis:  SCDs  Diet: NPO  Code Status: Full Family  Communication: Pt is awake and alert.  Disposition Plan: Will return home when medically appropriate.     Consultants:  GI  Procedures:  Enteroscopy 11/06  Colonoscopy 11/06  Antibiotics:  None  Objective: Filed Vitals:   02/22/14 1504 02/22/14 2157 02/23/14 0543 02/23/14 0749  BP: 108/54 129/76 113/62 103/59  Pulse: 55 51 45 44  Temp: 98.8 F (37.1 C) 98.1 F (36.7 C) 97.7 F (36.5 C) 98.1 F (36.7 C)  TempSrc: Oral Oral Oral Oral  Resp:  18 16 20   Height:      Weight:   57 kg (125 lb 10.6 oz)   SpO2: 99% 100% 98% 100%    Intake/Output Summary (Last 24 hours) at 02/23/14 1013 Last data filed at 02/23/14 [id number]  Gross per 24 hour  Intake   2958 ml  Output      0 ml  Net   2958 ml   Filed Weights   02/21/14 1035 02/23/14 0543  Weight: 56.7 kg (125 lb) 57 kg (125 lb 10.6 oz)    Exam: General: Thin male, appears stated age, NAD  HEENT:  EOMI, Anicteic Sclera, MMM. Neck: Supple, no JVD, no masses  Cardiovascular: Bradycardiac, regular rhythm, S1 S2 auscultated, no rubs, murmurs or gallops.   Respiratory: Clear to auscultation bilaterally with equal chest rise  Abdomen: Soft, nontender, nondistended, + bowel sounds  Extremities: warm dry without cyanosis clubbing or edema.  Neuro: AAOx3, cranial nerves grossly intact.  Skin: Without rashes exudates or nodules.     Data Reviewed: Basic Metabolic Panel:  Recent Labs Lab 02/21/14 1140  02/22/14 0534 02/23/14 0500  NA 141 144 145  K 4.2 4.1 4.2  CL 107 110 110  CO2 26 25 25   GLUCOSE 100* 74 80  BUN 19 13 14   CREATININE 0.90 0.96 1.12  CALCIUM 8.9 8.2* 8.5   Liver Function Tests:  Recent Labs Lab 02/21/14 1140 02/22/14 0534 02/23/14 0500  AST 20 14 15   ALT 17 13 14   ALKPHOS 71 57 59  BILITOT <0.2* 0.5 0.4  PROT 6.4 5.3* 5.8*  ALBUMIN 3.5 3.0* 3.3*   CBC:  Recent Labs Lab 02/21/14 1140 02/21/14 2023 02/22/14 0534 02/23/14 0500  WBC 4.7 5.5 5.2 5.6  NEUTROABS 3.1  --   --   --   HGB  11.2* 10.8* 10.0* 9.7*  HCT 33.5* 32.9* 30.7* 29.3*  MCV 97.1 95.6 96.2 96.7  PLT 255 261 248 256    Studies: No results found.  Scheduled Meds: . antiseptic oral rinse  7 mL Mouth Rinse q12n4p  . chlorhexidine  15 mL Mouth Rinse BID  . finasteride  5 mg Oral Daily  . multivitamin with minerals  1 tablet Oral Daily   Continuous Infusions: . sodium chloride 50 mL/hr at 02/22/14 1539  . sodium chloride      Active Problems:   GI bleed   Sinus bradycardia    Vicki MalletJessica Stevenson PA-S  [facility] Pager [phone]. If 7PM-7AM, please contact night-coverage at [email or web address], password [email or web address] 02/23/2014, 10:13 AM  LOS: 2 days        Patient seen and examined, chart and data base reviewed.  I agree with the above assessment and plan, I have reviewed and edited the note.   GI bleed, more overnight with the prep however hemoglobin overall stable, to have colonoscopy today.   Pamella Pertostin Henslee Lottman, MD [facility] [phone]

## 2014-02-23 NOTE — Anesthesia Postprocedure Evaluation (Signed)
  Anesthesia Post-op Note  Patient: Tyler Hunt  Procedure(s) Performed: Procedure(s) with comments: COLONOSCOPY (N/A) - ultraslim scope ENTEROSCOPY (N/A)  Patient Location: Endoscopy Unit  Anesthesia Type:MAC  Level of Consciousness: awake, alert  and oriented  Airway and Oxygen Therapy: Patient Spontanous Breathing  Post-op Pain: none  Post-op Assessment: Post-op Vital signs reviewed, Patient's Cardiovascular Status Stable, Respiratory Function Stable and Patent Airway  Post-op Vital Signs: Reviewed and stable  Last Vitals:  Filed Vitals:   02/23/14 1115  BP:   Pulse:   Temp: 36.2 C  Resp:     Complications: No apparent anesthesia complications

## 2014-02-23 NOTE — Op Note (Signed)
[facility] [address] [place], [id number]   ENTEROSCOPY PROCEDURE REPORT     EXAM DATE: 02/23/2014  PATIENT NAME:      Tyler Hunt, Tyler           MR #:      [id number]  BIRTHDATE:       07/25/1945      VISIT #:     [id number]  ATTENDING:     Beverley FiedlerJay M Izeyah Deike, MD     STATUS:     inpatient ASSISTANT:      Harold BarbanLiz Honeycutt and Runell Gessetteh, Samuel REFERRING MD: [facility] ASA CLASS:        Class III INDICATIONS:  68 yr old male here for an enteroscopy procedure due to Hematochezia and 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 PROCEDURE PERFORMED:     Small bowel enteroscopy with ablation therapy MEDICATIONS:     Monitored anesthesia care and Per Anesthesia  CONSENT: The patient understands the risks and benefits of the procedure and understands that these risks include, but are not limited to: sedation, allergic reaction, infection, perforation and/or bleeding. Alternative means of evaluation and treatment include, among others: physical exam, x-rays, and/or surgical intervention. The patient elects to proceed with this endoscopic procedure.  DESCRIPTION OF PROCEDURE: During intra-op preparation period all mechanical & medical equipment was checked for proper function. Hand hygiene and appropriate measures for infection prevention was taken. After the risks, benefits and alternatives of the procedure were thoroughly explained, Informed consent was verified, confirmed and timeout was successfully executed by the treatment team. The Pentax ultraslim colonoscope was introduced through the mouth and advanced to the proximal jejunum jejunum. The prep was The overall prep quality was good. The instrument was then slowly withdrawn while examining the mucosa circumferentially. The scope was then completely withdrawn from the patient and the procedure terminated. The pulse, BP, and O2 saturation were  monitored and documented by the physician and the nursing staff throughout the entire procedure.  The patient was cared for as planned according to standard protocol, then discharged to recovery in stable condition and with appropriate post procedure care.  ESOPHAGUS: The mucosa of the esophagus appeared normal.   A small hiatal hernia was noted.  STOMACH: Multiple erosions with mild gastritis was found in the gastric body and antrum. None were actively bleeding and there was no evidence of recent bleeding.  Cold forcep biopsies were taken from the gastric body, antrum and angularis to evaluate for h. pylori.  DUODENUM: Mild duodenal inflammation was found in the duodenal bulb. No ulcers were seen. The remainder of the examined duodenum was unremarkable.  JEJUNUM: The scope was able to be advanced with relative ease well into the proximal jejunum. One small lymphangitic ectasia was seen. 1 small angiodysplastic lesion with no bleeding was found in the proximal jejunum.  Argon plasma coagulation was applied to the site for ablation with good treatment effect.  No other bleeding lesions were seen.    ADVERSE EVENTS:      There were no immediate complications.     IMPRESSIONS:     1.  The mucosa of the esophagus appeared normal 2.  Small hiatal hernia 3.  Mild erosive gastritis; H. pylori biopsies performed 4.  Bulbar duodenitis without ulceration 5.  Otherwise normal duodenum 6.  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  Jejunal lymphangiectasia  RECOMMENDATIONS:     1.  Avoid NSAIDs 2.  Follow-up of helicobacter pylori status, treat if indicated 3.  Proceed to colonoscopy    Beverley FiedlerJay M Jahlil Ziller, MD eSigned:  Beverley FiedlerJay M Orlanda Frankum, MD 02/23/2014 1:15 PM   cc:   The Patient     PATIENT NAME:  Tyler Hunt, Tyler Hunt MR#: [id number]

## 2014-02-23 NOTE — Progress Notes (Signed)
Notified Schorr, NP via text page that pt's HR on telemetry is running 35-40's. Pt is asymptomatic. Will continue to monitor pt. Nelda MarseilleJenny Thacker, RN

## 2014-02-23 NOTE — Transfer of Care (Signed)
Immediate Anesthesia Transfer of Care Note  Patient: Tyler MuslimJames Hunt  Procedure(s) Performed: Procedure(s) with comments: COLONOSCOPY (N/A) - ultraslim scope ENTEROSCOPY (N/A)  Patient Location: Endoscopy Unit  Anesthesia Type:MAC  Level of Consciousness: awake, alert  and oriented  Airway & Oxygen Therapy: Patient Spontanous Breathing  Post-op Assessment: Report given to PACU RN  Post vital signs: Reviewed and stable  Complications: No apparent anesthesia complications

## 2014-02-23 NOTE — Plan of Care (Signed)
Problem: Phase II Progression Outcomes Goal: No active bleeding Outcome: Progressing Goal: Hemodynamically stable Outcome: Progressing Goal: H&H stablized < 1gm drop in 24 hrs Outcome: Progressing Goal: Progress activity as tolerated unless otherwise ordered Outcome: Completed/Met Date Met:  02/23/14 Goal: Tolerating diet Outcome: Completed/Met Date Met:  02/23/14

## 2014-02-24 LAB — COMPREHENSIVE METABOLIC PANEL
ALT: 11 U/L (ref 0–53)
AST: 14 U/L (ref 0–37)
Albumin: 2.9 g/dL — ABNORMAL LOW (ref 3.5–5.2)
Alkaline Phosphatase: 60 U/L (ref 39–117)
Anion gap: 10 (ref 5–15)
BUN: 7 mg/dL (ref 6–23)
CALCIUM: 8.2 mg/dL — AB (ref 8.4–10.5)
CHLORIDE: 108 meq/L (ref 96–112)
CO2: 23 mEq/L (ref 19–32)
Creatinine, Ser: 0.96 mg/dL (ref 0.50–1.35)
GFR calc Af Amer: >90 mL/min (ref >90)
GFR calc non Af Amer: 85 mL/min — ABNORMAL LOW (ref >90)
Glucose, Bld: 79 mg/dL (ref 70–99)
Potassium: 3.6 mEq/L — ABNORMAL LOW (ref 3.7–5.3)
SODIUM: 141 meq/L (ref 137–147)
Total Bilirubin: <0.2 mg/dL — ABNORMAL LOW (ref 0.3–1.2)
Total Protein: 5.4 g/dL — ABNORMAL LOW (ref 6.0–8.3)

## 2014-02-24 LAB — CBC
HEMATOCRIT: 26 % — AB (ref 39.0–52.0)
Hemoglobin: 8.6 g/dL — ABNORMAL LOW (ref 13.0–17.0)
MCH: 31.6 pg (ref 26.0–34.0)
MCHC: 33.1 g/dL (ref 30.0–36.0)
MCV: 95.6 fL (ref 78.0–100.0)
PLATELETS: 220 10*3/uL (ref 150–400)
RBC: 2.72 MIL/uL — AB (ref 4.22–5.81)
RDW: 13.5 % (ref 11.5–15.5)
WBC: 7.9 10*3/uL (ref 4.0–10.5)

## 2014-02-24 MED ORDER — PANTOPRAZOLE SODIUM 40 MG PO TBEC: 40.0000 mg | DELAYED_RELEASE_TABLET | Freq: Every day | ORAL | Status: DC

## 2014-02-24 NOTE — Discharge Instructions (Signed)
You were cared for by a hospitalist during your hospital stay. If you have any questions about your discharge medications or the care you received while you were in the hospital after you are discharged, you can call the unit and asked to speak with the hospitalist on call if the hospitalist that took care of you is not available. Once you are discharged, your primary care physician will handle any further medical issues. Please note that NO REFILLS for any discharge medications will be authorized once you are discharged, as it is imperative that you return to your primary care physician (or establish a relationship with a primary care physician if you do not have one) for your aftercare needs so that they can reassess your need for medications and monitor your lab values.     If you do not have a primary care physician, you can call [phone] for a physician referral.  Follow with Primary Tyler Hunt Tyler Hunt, ROBERT, Tyler Hunt in 5-7 days   Get CBC, CMP checked by your doctor and again as further instructed.  Get a 2 view Chest X ray done next visit if you had Pneumonia of Lung problems at the Hospital.  Get Medicines reviewed and adjusted.  Please request your Prim.Tyler Hunt to go over all Hospital Tests and Procedure/Radiological results at the follow up, please get all Hospital records sent to your Prim Tyler Hunt by signing hospital release before you go home.  Activity: As tolerated with Full fall precautions use walker/cane & assistance as needed  Diet: regular  For Heart failure patients - Check your Weight same time everyday, if you gain over 2 pounds, or you develop in leg swelling, experience more shortness of breath or chest pain, call your Primary Tyler Hunt immediately. Follow Cardiac Low Salt Diet and 1.8 lit/day fluid restriction.  Disposition Home   If you experience worsening of your admission symptoms, develop shortness of breath, life threatening emergency, suicidal or homicidal thoughts you must seek medical  attention immediately by calling 911 or calling your Tyler Hunt immediately  if symptoms less severe.  You Must read complete instructions/literature along with all the possible adverse reactions/side effects for all the Medicines you take and that have been prescribed to you. Take any new Medicines after you have completely understood and accpet all the possible adverse reactions/side effects.   Do not drive and provide baby sitting services if your were admitted for syncope or siezures until you have seen by Primary Tyler Hunt or a Neurologist and advised to do so again.  Do not drive when taking Pain medications.   Do not take more than prescribed Pain, Sleep and Anxiety Medications  Special Instructions: If you have smoked or chewed Tobacco  in the last 2 yrs please stop smoking, stop any regular Alcohol  and or any Recreational drug use.  Wear Seat belts while driving.

## 2014-02-24 NOTE — Discharge Summary (Signed)
Physician Discharge Summary  Tyler Hunt ZOX:[id number] DOB: January 06, 1946 DOA: 02/21/2014  PCP: [name], MD  Admit date: 02/21/2014 Discharge date: 02/24/2014  Time spent: 35 minutes  Recommendations for Outpatient Follow-up:  1. Follow-up with her primary care provider in 3 days   Recommendations for primary care physician for things to follow:  Repeat CBC  Discharge Diagnoses:  Active Problems:   GI bleed   Sinus bradycardia   Angiodysplasia of intestinal tract   Discharge Condition: stable  Diet recommendation: regular  Filed Weights   02/21/14 1035 02/23/14 0543 02/24/14 0618  Weight: 56.7 kg (125 lb) 57 kg (125 lb 10.6 oz) 55.6 kg (122 lb 9.2 oz)    History of present illness:  GIB, sinus bradycardia History of Present Illness:This is a 68 y.o. year old male with significant past medical history of GI bleeding, sinus bradycardia, hx/o SVT, GERD, HTN, tobacco abuse presenting with GIB, sinus bradycardia. Pt states that he has noticed intermittent maroon colored stools over past 1-2 weeks. Had episode of BRBPR earlier today. States that he had a similar episode 2 years ago. Was hospitalized in [facility] for this. Per pt, he had an endoscopy that showed ? Gastric disease. However, he did not have a colonoscopy done. Pt states that he has never had colonoscopy in the past. Denies any recent NSAID use apart from daily baby ASA. + occasional ETOH. Daily smoker. Mild lower abd pain.  Presented to [facility] w/ 98.3, HR 40s-50s, BP 110s-130s. Hgb 11.2. Hemoccult +. EKG shows marked sinus bradycardia.  Pt states that he was evaluated 1-2 years ago for palpitations/ SVT. Was put on "medication" for this. States that he has had slow heart rate since this point. Currently denies any CP, SOB, weakness, dizziness.  Assessment and Plan: Tyler Hunt is a 68 y.o. year old male presenting with GIB, sinus bradycardia   Hospital Course:  Patient was admitted with acute on chronic GI  bleed. Gastroenterology was consulted and evaluated patient, and he underwent an EGD with enteroscopy as well as colonoscopy. His colonoscopy was fairly unremarkable, however on his EGD there was one angiodysplastic lesion in the proximal jejunum status post APC. Patient did well post-procedurally, was tolerating a regular diet, and was discharged home in stable condition. His hemoglobin has been fairly stable and patient did not require blood transfusions. Hemoglobin with mild drop likely due to re-equilibration and IV fluids, he was clinically without further bleeding and will follow up with his primary care provider on Monday or Tuesday for repeat CBC. His aspirin was discontinued on discharge and it may be started in the future if he has no further episodes of bleeding. He was started on a PPI on discharge. Biopsies for H. Pylori were obtained Dewayne Hatch and the results are not available at the time of his discharge and they will need to be followed up as an outpatient. Patient was relatively bradycardic while hospitalized with a heart rate in the mid 40s, asymptomatic, and his beta blocker was discontinued on discharge.   Procedures:  EGD with small bowel endoscopy   1. The mucosa of the esophagus appeared normal 2. Small hiatal hernia 3. Mild erosive gastritis; H. pylori biopsies performed 4. Bulbar duodenitis without ulceration 5. Otherwise normal duodenum 6. Angiodysplastic lesion with no bleeding was found in the proximal jejunum; Argon plasma coagulation was applied to the site with good treatment effect 7. Jejunal lymphangiectasia  Colonoscopy ENDOSCOPIC IMPRESSION: Normal colonoscopy  Consultations:  gastroenterology  Discharge Exam: Filed Vitals:  02/23/14 1330 02/23/14 1349 02/23/14 2218 02/24/14 0618  BP: 142/68 123/52 116/55 126/57  Pulse: 56 60 58 48  Temp:  97.5 F (36.4 C) 98.2 F (36.8 C) 98.2 F (36.8 C)  TempSrc:  Oral Oral Oral  Resp: 16 16 20 17   Height:        Weight:    55.6 kg (122 lb 9.2 oz)  SpO2: 100% 100% 100% 98%    General:  No acute distress Cardiovascular: regular rate and rhythm Respiratory: clear to auscultation bilaterally  Discharge Instructions     Medication List    STOP taking these medications        aspirin 81 MG chewable tablet     metoprolol succinate 50 MG 24 hr tablet  Commonly known as:  TOPROL-XL      TAKE these medications        albuterol-ipratropium 18-103 MCG/ACT inhaler  Commonly known as:  COMBIVENT  Inhale 1 puff into the lungs every 4 (four) hours.     AZOR 10-40 MG per tablet  Generic drug:  amLODipine-olmesartan  Take 1 tablet by mouth daily.     ferrous sulfate 325 (65 FE) MG tablet  Take 325 mg by mouth daily with breakfast.     finasteride 5 MG tablet  Commonly known as:  PROSCAR  Take 5 mg by mouth daily as needed.     multivitamin with minerals Tabs tablet  Take 1 tablet by mouth daily.     olmesartan 40 MG tablet  Commonly known as:  BENICAR  Take 40 mg by mouth daily.     pantoprazole 40 MG tablet  Commonly known as:  PROTONIX  Take 1 tablet (40 mg total) by mouth daily at 6 (six) AM.     tamsulosin 0.4 MG Caps capsule  Commonly known as:  FLOMAX  Take 0.4 mg by mouth daily.           Follow-up Information    Follow up with Rocky MorelOSTAND, ROBERT, MD. Schedule an appointment as soon as possible for a visit in 3 days.   Specialty:  Internal Medicine   Why:  repeat blood counts   Contact information:   [address] [place] [address] [phone]       The results of significant diagnostics from this hospitalization (including imaging, microbiology, ancillary and laboratory) are listed below for reference.    Labs: Basic Metabolic Panel:  Recent Labs Lab 02/21/14 1140 02/22/14 0534 02/23/14 0500 02/24/14 0318  NA 141 144 145 141  K 4.2 4.1 4.2 3.6*  CL 107 110 110 108  CO2 26 25 25 23   GLUCOSE 100* 74 80 79  BUN 19 13 14 7   CREATININE 0.90 0.96 1.12  0.96  CALCIUM 8.9 8.2* 8.5 8.2*   Liver Function Tests:  Recent Labs Lab 02/21/14 1140 02/22/14 0534 02/23/14 0500 02/24/14 0318  AST 20 14 15 14   ALT 17 13 14 11   ALKPHOS 71 57 59 60  BILITOT <0.2* 0.5 0.4 <0.2*  PROT 6.4 5.3* 5.8* 5.4*  ALBUMIN 3.5 3.0* 3.3* 2.9*   CBC:  Recent Labs Lab 02/21/14 1140 02/21/14 2023 02/22/14 0534 02/23/14 0500 02/24/14 0318  WBC 4.7 5.5 5.2 5.6 7.9  NEUTROABS 3.1  --   --   --   --   HGB 11.2* 10.8* 10.0* 9.7* 8.6*  HCT 33.5* 32.9* 30.7* 29.3* 26.0*  MCV 97.1 95.6 96.2 96.7 95.6  PLT 255 261 248 256 220  SignedPamella Pert:  Awesome Jared  [facility] 02/24/2014, 3:39 PM

## 2014-02-26 ENCOUNTER — Encounter (HOSPITAL_COMMUNITY): Payer: Self-pay | Admitting: Internal Medicine

## 2014-02-26 NOTE — Progress Notes (Signed)
CARE MANAGEMENT NOTE 02/26/2014  Patient:  Tyler Hunt,Tyler Hunt   Account Number:  [id number]  Date Initiated:  02/23/2014  Documentation initiated by:  Letha CapeAYLOR,DEBORAH  Subjective/Objective Assessment:   dx gib  admit- lives alone     Action/Plan:   11/6- colonscopy today.   Anticipated DC Date:  02/24/2014   Anticipated DC Plan:  HOME/SELF CARE      DC Planning Services  CM consult      Choice offered to / List presented to:             Status of service:  Completed, signed off Medicare Important Message given?  YES (If response is "NO", the following Medicare IM given date fields will be blank) Date Medicare IM given:  02/23/2014 Medicare IM given by:  Letha CapeAYLOR,DEBORAH Date Additional Medicare IM given:   Additional Medicare IM given by:    Discharge Disposition:  HOME/SELF CARE  Per UR Regulation:  Reviewed for med. necessity/level of care/duration of stay  If discussed at Long Length of Stay Meetings, dates discussed:    Comments:  02/26/2014 1020 No NCM needs identified. Tyler DonningAlesia Mars Scheaffer RN CCM Case Mgmt phone [phone]  02/23/14 1047 [address]Deborah Taylor RN, [id number] [phone] pateint lives alone.  NCM will continue to follow for dc needs.

## 2014-02-27 ENCOUNTER — Ambulatory Visit: Payer: BC Managed Care – PPO | Admitting: Physician Assistant

## 2014-03-29 ENCOUNTER — Encounter (HOSPITAL_COMMUNITY): Payer: Self-pay | Admitting: Cardiovascular Disease

## 2014-05-23 ENCOUNTER — Encounter (HOSPITAL_BASED_OUTPATIENT_CLINIC_OR_DEPARTMENT_OTHER): Payer: Self-pay | Admitting: Emergency Medicine

## 2014-05-23 ENCOUNTER — Inpatient Hospital Stay (HOSPITAL_BASED_OUTPATIENT_CLINIC_OR_DEPARTMENT_OTHER)
Admission: EM | Admit: 2014-05-23 | Discharge: 2014-05-25 | DRG: 378 | Disposition: A | Discharge status: Home / Self Care | Payer: Medicare HMO | Attending: Internal Medicine | Admitting: Internal Medicine

## 2014-05-23 DIAGNOSIS — N400 Enlarged prostate without lower urinary tract symptoms: Secondary | ICD-10-CM | POA: Diagnosis present

## 2014-05-23 DIAGNOSIS — I472 Ventricular tachycardia: Secondary | ICD-10-CM | POA: Diagnosis present

## 2014-05-23 DIAGNOSIS — K5520 Angiodysplasia of colon without hemorrhage: Secondary | ICD-10-CM | POA: Diagnosis present

## 2014-05-23 DIAGNOSIS — D62 Acute posthemorrhagic anemia: Secondary | ICD-10-CM | POA: Diagnosis present

## 2014-05-23 DIAGNOSIS — G8929 Other chronic pain: Secondary | ICD-10-CM | POA: Diagnosis present

## 2014-05-23 DIAGNOSIS — I959 Hypotension, unspecified: Secondary | ICD-10-CM | POA: Diagnosis present

## 2014-05-23 DIAGNOSIS — I4891 Unspecified atrial fibrillation: Secondary | ICD-10-CM | POA: Diagnosis present

## 2014-05-23 DIAGNOSIS — I10 Essential (primary) hypertension: Secondary | ICD-10-CM | POA: Diagnosis present

## 2014-05-23 DIAGNOSIS — K922 Gastrointestinal hemorrhage, unspecified: Principal | ICD-10-CM | POA: Diagnosis present

## 2014-05-23 DIAGNOSIS — K219 Gastro-esophageal reflux disease without esophagitis: Secondary | ICD-10-CM | POA: Diagnosis present

## 2014-05-23 DIAGNOSIS — K625 Hemorrhage of anus and rectum: Secondary | ICD-10-CM

## 2014-05-23 DIAGNOSIS — R710 Precipitous drop in hematocrit: Secondary | ICD-10-CM | POA: Diagnosis present

## 2014-05-23 DIAGNOSIS — F17210 Nicotine dependence, cigarettes, uncomplicated: Secondary | ICD-10-CM | POA: Diagnosis present

## 2014-05-23 LAB — COMPREHENSIVE METABOLIC PANEL
ALBUMIN: 4.1 g/dL (ref 3.5–5.2)
ALK PHOS: 74 U/L (ref 39–117)
ALT: 20 U/L (ref 0–53)
AST: 20 U/L (ref 0–37)
Anion gap: 4 — ABNORMAL LOW (ref 5–15)
BILIRUBIN TOTAL: 0.3 mg/dL (ref 0.3–1.2)
BUN: 26 mg/dL — AB (ref 6–23)
CO2: 25 mmol/L (ref 19–32)
Calcium: 8.6 mg/dL (ref 8.4–10.5)
Chloride: 109 mmol/L (ref 96–112)
Creatinine, Ser: 1.26 mg/dL (ref 0.50–1.35)
GFR calc Af Amer: 67 mL/min — ABNORMAL LOW (ref >90)
GFR calc non Af Amer: 58 mL/min — ABNORMAL LOW (ref >90)
Glucose, Bld: 108 mg/dL — ABNORMAL HIGH (ref 70–99)
POTASSIUM: 4.3 mmol/L (ref 3.5–5.1)
Sodium: 138 mmol/L (ref 135–145)
TOTAL PROTEIN: 7.1 g/dL (ref 6.0–8.3)

## 2014-05-23 LAB — CBC
HCT: 34.3 % — ABNORMAL LOW (ref 39.0–52.0)
Hemoglobin: 11.5 g/dL — ABNORMAL LOW (ref 13.0–17.0)
MCH: 30.1 pg (ref 26.0–34.0)
MCHC: 33.5 g/dL (ref 30.0–36.0)
MCV: 89.8 fL (ref 78.0–100.0)
PLATELETS: 249 10*3/uL (ref 150–400)
RBC: 3.82 MIL/uL — ABNORMAL LOW (ref 4.22–5.81)
RDW: 13.3 % (ref 11.5–15.5)
WBC: 10.2 10*3/uL (ref 4.0–10.5)

## 2014-05-23 LAB — GLUCOSE, CAPILLARY: GLUCOSE-CAPILLARY: 90 mg/dL (ref 70–99)

## 2014-05-23 LAB — OCCULT BLOOD X 1 CARD TO LAB, STOOL: FECAL OCCULT BLD: POSITIVE — AB

## 2014-05-23 LAB — TROPONIN I: Troponin I: <0.03 ng/mL (ref <0.031)

## 2014-05-23 LAB — TSH: TSH: 0.5 u[IU]/mL (ref 0.350–4.500)

## 2014-05-23 LAB — MRSA PCR SCREENING: MRSA by PCR: NEGATIVE

## 2014-05-23 MED ORDER — IPRATROPIUM-ALBUTEROL 18-103 MCG/ACT IN AERO: 1.0000 | INHALATION_SPRAY | RESPIRATORY_TRACT | Status: DC

## 2014-05-23 MED ORDER — ONDANSETRON HCL 4 MG PO TABS: 4.0000 mg | ORAL_TABLET | Freq: Four times a day (QID) | ORAL | Status: DC | PRN

## 2014-05-23 MED ORDER — SODIUM CHLORIDE 0.9 % IV SOLN
INTRAVENOUS | Status: AC
  Administered 2014-05-23: 07:00:00 via INTRAVENOUS

## 2014-05-23 MED ORDER — PANTOPRAZOLE SODIUM 40 MG IV SOLR
40.0000 mg | Freq: Two times a day (BID) | INTRAVENOUS | Status: DC
  Administered 2014-05-23 – 2014-05-25 (×5): 40 mg via INTRAVENOUS
  Filled 2014-05-23 (×6): qty 40

## 2014-05-23 MED ORDER — SODIUM CHLORIDE 0.9 % IV BOLUS (SEPSIS)
1000.0000 mL | Freq: Once | INTRAVENOUS | Status: AC
  Administered 2014-05-23: 1000 mL via INTRAVENOUS

## 2014-05-23 MED ORDER — TAMSULOSIN HCL 0.4 MG PO CAPS
0.4000 mg | ORAL_CAPSULE | Freq: Every day | ORAL | Status: DC
  Administered 2014-05-23 – 2014-05-25 (×3): 0.4 mg via ORAL
  Filled 2014-05-23 (×3): qty 1

## 2014-05-23 MED ORDER — AMLODIPINE BESYLATE 10 MG PO TABS: 10.0000 mg | ORAL_TABLET | Freq: Every day | ORAL | Status: DC

## 2014-05-23 MED ORDER — AMLODIPINE-OLMESARTAN 10-40 MG PO TABS: 1.0000 | ORAL_TABLET | Freq: Every day | ORAL | Status: DC

## 2014-05-23 MED ORDER — IPRATROPIUM-ALBUTEROL 0.5-2.5 (3) MG/3ML IN SOLN
3.0000 mL | RESPIRATORY_TRACT | Status: DC
  Administered 2014-05-23 – 2014-05-24 (×4): 3 mL via RESPIRATORY_TRACT
  Filled 2014-05-23 (×4): qty 3

## 2014-05-23 MED ORDER — SODIUM CHLORIDE 0.9 % IV SOLN
INTRAVENOUS | Status: DC
  Administered 2014-05-23 (×2): 75 mL/h via INTRAVENOUS

## 2014-05-23 MED ORDER — IRBESARTAN 300 MG PO TABS
300.0000 mg | ORAL_TABLET | Freq: Every day | ORAL | Status: DC
  Filled 2014-05-23: qty 1

## 2014-05-23 MED ORDER — IPRATROPIUM-ALBUTEROL 0.5-2.5 (3) MG/3ML IN SOLN
3.0000 mL | Freq: Four times a day (QID) | RESPIRATORY_TRACT | Status: DC
  Administered 2014-05-23: 3 mL via RESPIRATORY_TRACT
  Filled 2014-05-23: qty 3

## 2014-05-23 MED ORDER — ONDANSETRON HCL 4 MG/2ML IJ SOLN: 4.0000 mg | Freq: Four times a day (QID) | INTRAMUSCULAR | Status: DC | PRN

## 2014-05-23 MED ORDER — IPRATROPIUM-ALBUTEROL 0.5-2.5 (3) MG/3ML IN SOLN
3.0000 mL | Freq: Four times a day (QID) | RESPIRATORY_TRACT | Status: DC
  Administered 2014-05-23: 3 mL via RESPIRATORY_TRACT

## 2014-05-23 MED ORDER — IPRATROPIUM-ALBUTEROL 0.5-2.5 (3) MG/3ML IN SOLN
RESPIRATORY_TRACT | Status: AC
  Filled 2014-05-23: qty 3

## 2014-05-23 MED ORDER — ACETAMINOPHEN 650 MG RE SUPP: 650.0000 mg | Freq: Four times a day (QID) | RECTAL | Status: DC | PRN

## 2014-05-23 MED ORDER — ACETAMINOPHEN 325 MG PO TABS: 650.0000 mg | ORAL_TABLET | Freq: Four times a day (QID) | ORAL | Status: DC | PRN

## 2014-05-23 MED ORDER — CHLORHEXIDINE GLUCONATE 0.12 % MT SOLN
15.0000 mL | Freq: Two times a day (BID) | OROMUCOSAL | Status: DC
  Administered 2014-05-23 (×2): 15 mL via OROMUCOSAL
  Filled 2014-05-23 (×2): qty 15

## 2014-05-23 MED ORDER — SODIUM CHLORIDE 0.9 % IJ SOLN
3.0000 mL | Freq: Two times a day (BID) | INTRAMUSCULAR | Status: DC
  Administered 2014-05-23 – 2014-05-25 (×5): 3 mL via INTRAVENOUS

## 2014-05-23 MED ORDER — FERROUS SULFATE 325 (65 FE) MG PO TABS
325.0000 mg | ORAL_TABLET | Freq: Every day | ORAL | Status: DC
  Administered 2014-05-23 – 2014-05-25 (×3): 325 mg via ORAL
  Filled 2014-05-23 (×4): qty 1

## 2014-05-23 MED ORDER — ADULT MULTIVITAMIN W/MINERALS CH
1.0000 | ORAL_TABLET | Freq: Every day | ORAL | Status: DC
  Administered 2014-05-23 – 2014-05-25 (×3): 1 via ORAL
  Filled 2014-05-23 (×3): qty 1

## 2014-05-23 MED ORDER — CETYLPYRIDINIUM CHLORIDE 0.05 % MT LIQD: 7.0000 mL | Freq: Two times a day (BID) | OROMUCOSAL | Status: DC

## 2014-05-23 MED ORDER — IPRATROPIUM-ALBUTEROL 0.5-2.5 (3) MG/3ML IN SOLN: 3.0000 mL | Freq: Four times a day (QID) | RESPIRATORY_TRACT | Status: DC | PRN

## 2014-05-23 NOTE — ED Notes (Signed)
Patient states that he has been having bloody stools x 2 -3 weeks since he was d/c'd from the hospital. Today he had at least one bloody stool.

## 2014-05-23 NOTE — H&P (Signed)
[facility] History and Physical  Tyler Hunt ZOX:[id number] DOB: 07-Jun-1945 DOA: 05/23/2014  Referring physician: ER physician PCP: [name], MD   Chief Complaint: GI bleed  HPI:  69 year old male with past medical history of BPH, hypertension, small bowel AVM's (last colonoscopy and enteroscopy in 02/2014) specifically when he had enteroscopy in 02/2014 he did not have bleeding in the proximal jejunum (argon plasma coagulation was applied at that time). He now presented with blood per rectum with or without bowel movement. This has started about few days ago but he decided to seek medical attention now since he felt lightheaded and almost passed out. No diarrhea or constipation. No fevers or chills. No abdominal pain, nausea or vomiting. No chest pain, shortness of breath or palpitations. No GU complaints. In ED, pt was found to have slight hypotension BP 82/50 but this has improved with IV fluids to 110/71, HR 82, RR 14, T max 98.1 F. Blood work showed slight anemia and hemoglobin of 11.5, normal creatinine. He was admitted to SDU because of hypotension.   Assessment & Plan    Principal Problem:   Acute GI bleeding /  Acute blood loss anemia / iron deficiency anemia - in pt with history of small bowel AVM's - last colonoscopy and enteroscopy in 02/2014 - appreciate GI seeing the pt and their recommendations  - started protonix 40 mg IV Q 12 hours - continue ferrous sulfate supplementation  - continue supportive care with IV fluids - continue to monitor in SDU while pt is hypotensive    Active Problems:   BPH (benign prostatic hyperplasia) - continue Flomax daily    Essential hypertension - hold norvasc and avapro until BP improves    Radiological Exams on Admission: No results found.  Code Status: Full Family Communication: Plan of care discussed with the patient  Disposition Plan: Admit for further evaluation  Manson Passey, MD  [facility] Pager  [phone]  Review of Systems:  Constitutional: Negative for fever, chills and malaise/fatigue. Negative for diaphoresis.  HENT: Negative for hearing loss, ear pain, nosebleeds, congestion, sore throat, neck pain, tinnitus and ear discharge.   Eyes: Negative for blurred vision, double vision, photophobia, pain, discharge and redness.  Respiratory: Negative for cough, hemoptysis, sputum production, shortness of breath, wheezing and stridor.   Cardiovascular: Negative for chest pain, palpitations, orthopnea, claudication and leg swelling.  Gastrointestinal: per HPI  Genitourinary: Negative for dysuria, urgency, frequency, hematuria and flank pain.  Musculoskeletal: Negative for myalgias, back pain, joint pain and falls.  Skin: Negative for itching and rash.  Neurological: Negative for dizziness and weakness. Negative for tingling, tremors, sensory change, speech change, focal weakness, loss of consciousness and headaches.  Endo/Heme/Allergies: Negative for environmental allergies and polydipsia. Does not bruise/bleed easily.  Psychiatric/Behavioral: Negative for suicidal ideas. The patient is not nervous/anxious.      Past Medical History  Diagnosis Date  . Chest pain 02/2013    CP in left anterior and left lateral chest. Described as pressure-like & sharp. Pain is moderate & worsening. Sx exacerbated by lying down and spicy foods. Has associated heartburn, nausea & weakness.  . Carotid bruit   . Benign hypertension   . Syncope and collapse 02/2013    felt to be vasovagal.   . Tobacco abuse   . DOE (dyspnea on exertion)   . Acid reflux   . Carotid bruit   . Palpitations   . GI bleed 2014  . SOB (shortness of breath)   .  Atrial fibrillation     01-2013 - identified on holter monitor, RVR with abberration  . SVT (supraventricular tachycardia)     Sx include palpitations, chest pain and near syncope. Pain located in substernal area.  . Ventricular tachycardia     ECHO 02/15/13 - EF  between 55-60%  . Enlarged prostate    Past Surgical History  Procedure Laterality Date  . Colonoscopy N/A 02/23/2014    Procedure: COLONOSCOPY;  Surgeon: Beverley Fiedler, MD;  Location: [facility] ENDOSCOPY;  Service: Endoscopy;  Laterality: N/A;  ultraslim scope  . Enteroscopy N/A 02/23/2014    Procedure: ENTEROSCOPY;  Surgeon: Beverley Fiedler, MD;  Location: [facility] ENDOSCOPY;  Service: Endoscopy;  Laterality: N/A;  . Left heart catheterization with coronary angiogram N/A 02/21/2013    Procedure: LEFT HEART CATHETERIZATION WITH CORONARY ANGIOGRAM;  Surgeon: [name], MD;  Location[facility] C[facility] LAB;  Service: Cardiovascular;  Laterality: N/A;   Social History:  reports that he has been smoking Cigarettes.  He has a 20 pack-year smoking history. He does not have any smokeless tobacco history on file. He reports that he drinks alcohol. His drug history is not on file.  No Known Allergies  Family History:  Family History  Problem Relation Age of Onset  . Hyperlipidemia Mother   . Hyperlipidemia Father   . Hyperlipidemia Brother   . Hypertension Mother   . Hypertension Father   . Hypertension Sister   . Hypertension Brother      Prior to Admission medications   Medication Sig Start Date End Date Taking? Authorizing Provider  albuterol-ipratropium (COMBIVENT) 18-103 MCG/ACT inhaler Inhale 1 puff into the lungs every 4 (four) hours.    Historical Provider, MD  amLODipine-olmesartan (AZOR) 10-40 MG per tablet Take 1 tablet by mouth daily.    Historical Provider, MD  ferrous sulfate 325 (65 FE) MG tablet Take 325 mg by mouth daily with breakfast.    Historical Provider, MD  finasteride (PROSCAR) 5 MG tablet Take 5 mg by mouth daily as needed.     Historical Provider, MD  Multiple Vitamin (MULTIVITAMIN WITH MINERALS) TABS tablet Take 1 tablet by mouth daily.    Historical Provider, MD  olmesartan (BENICAR) 40 MG tablet Take 40 mg by mouth daily.    Historical Provider, MD  pantoprazole (PROTONIX) 40 MG  tablet Take 1 tablet (40 mg total) by mouth daily at 6 (six) AM. 02/24/14   Costin Otelia Sergeant, MD  tamsulosin (FLOMAX) 0.4 MG CAPS capsule Take 0.4 mg by mouth daily.    Historical Provider, MD   Physical Exam: Filed Vitals:   05/23/14 1610 05/23/14 0503 05/23/14 0640 05/23/14 0641  BP: 95/56 98/63 111/71   Pulse: 69 82 60   Temp:    97.9 F (36.6 C)  TempSrc:    Oral  Resp: Height:     (1.702 m)  Weight:    58.4 kg (128 lb 12 oz)  SpO2: 100% 98% 100%     Physical Exam  Constitutional: Appears well-developed and well-nourished. No distress.  HENT: Normocephalic. No tonsillar erythema or exudates Eyes: Conjunctivae and EOM are normal. PERRLA, no scleral icterus.  Neck: Normal ROM. Neck supple. No JVD. No tracheal deviation. No thyromegaly.  CVS: RRR, S1/S2 +, no murmurs, no gallops, no carotid bruit.  Pulmonary: Effort and breath sounds normal, no stridor, rhonchi, wheezes, rales.  Abdominal: Soft. BS +,  no distension, tenderness, rebound or guarding.  Musculoskeletal: Normal range of motion.  No edema and no tenderness.  Lymphadenopathy: No lymphadenopathy noted, cervical, inguinal. Neuro: Alert. Normal reflexes, muscle tone coordination. No focal neurologic deficits. Skin: Skin is warm and dry. No rash noted.  No erythema. No pallor.  Psychiatric: Normal mood and affect. Behavior, judgment, thought content normal.   Labs on Admission:  Basic Metabolic Panel:  Recent Labs Lab 05/23/14 0350  NA 138  K 4.3  CL 109  CO2 25  GLUCOSE 108*  BUN 26*  CREATININE 1.26  CALCIUM 8.6   Liver Function Tests:  Recent Labs Lab 05/23/14 0350  AST 20  ALT 20  ALKPHOS 74  BILITOT 0.3  PROT 7.1  ALBUMIN 4.1   No results for input(s): LIPASE, AMYLASE in the last 168 hours. No results for input(s): AMMONIA in the last 168 hours. CBC:  Recent Labs Lab 05/23/14 0350  WBC 10.2  HGB 11.5*  HCT 34.3*  MCV 89.8  PLT 249   Cardiac Enzymes:  Recent Labs Lab  05/23/14 0400  TROPONINI <0.03   BNP: Invalid input(s): POCBNP CBG: No results for input(s): GLUCAP in the last 168 hours.  If 7PM-7AM, please contact night-coverage [email or web address] Password [email or web address] 05/23/2014, 7:14 AM

## 2014-05-23 NOTE — Plan of Care (Signed)
69 yo M admitted in nov for GIB, found one site of angiodysplasia which was cauterized.  But for past 2 weeks has been having blood in stool again (BRBPR), significantly worsened today.  Hypotensive on arrival with BP in low 80s which improved with IVF.  No tachycardia.  Readmitting for GIB recurrence.  EDP requests SDU and none available at [facility].  Consult [facility] on arrival.

## 2014-05-23 NOTE — Progress Notes (Signed)
CARE MANAGEMENT NOTE 05/23/2014  Patient:  Tyler Hunt,Tyler Hunt   Account Number:  [id number]  Date Initiated:  05/23/2014  Documentation initiated by:  Chrisanna Mishra  Subjective/Objective Assessment:   69 yo M admitted in nov for GIB, found one site of angiodysplasia which was cauterized. But for past 2 weeks has been having blood in stool again (BRBPR), significantly worsened today.  Hypotensive on arrival with BP in low 80s which improved after iv flds     Action/Plan:   home when stable   Anticipated DC Date:  05/26/2014   Anticipated DC Plan:  HOME/SELF CARE  In-house referral  NA      DC Planning Services  CM consult      PAC Choice  NA  NA   Choice offered to / List presented to:  NA   DME arranged  NA      DME agency  NA     HH arranged  NA      HH agency  NA   Status of service:  In process, will continue to follow Medicare Important Message given?   (If response is "NO", the following Medicare IM given date fields will be blank) Date Medicare IM given:   Medicare IM given by:   Date Additional Medicare IM given:   Additional Medicare IM given by:    Discharge Disposition:    Per UR Regulation:  Reviewed for med. necessity/level of care/duration of stay  If discussed at Long Length of Stay Meetings, dates discussed:    Comments:  [id number]/Elinora Weigand Stark JockDavis, RN, BSN, CCN: [phone]/ Case management. Chart reviewed for discharge planning and present needs. Discharge needs: none present at time of review. Next chart review due:  [id number]

## 2014-05-23 NOTE — Consult Note (Signed)
Consultation  Referring Provider: [facility]     Primary Care Physician:  [name], MD Primary Gastroenterologist:  Gentry Fitz       Reason for Consultation:   GI bleed           HPI:   Tyler Hunt is a 69 y.o. male with a history of anemia  Evaluated by EGD / colonoscopy / capsule study in 2014 [facility]). We saw him inpatient in November 2015 for recurrent anemia and intermittent hematochezia. Inpatient colonoscopy was normal. 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   Patient presented to ED early this am with recurrent hematochezia. Hgb is at baseline. Minimal BUN elevation. He passes some stool mixed with blood 4 days ago then at 12am today felt need to have BM but just passed blood. No further episodes. Patient describes chronic, constant lower abdominal pain. The pain is not related to meals or defecation. It didn't get worse with onset of bleeding. No nausea. No other GI complaints. No NSAID use. He takes BID PPI   Past Medical History  Diagnosis Date  . Chest pain 02/2013    CP in left anterior and left lateral chest. Described as pressure-like & sharp. Pain is moderate & worsening. Sx exacerbated by lying down and spicy foods. Has associated heartburn, nausea & weakness.  . Carotid bruit   . Benign hypertension   . Syncope and collapse 02/2013    felt to be vasovagal.   . Tobacco abuse   . DOE (dyspnea on exertion)   . Acid reflux   . Carotid bruit   . Palpitations   . GI bleed 2014  . Atrial fibrillation     01-2013 - identified on holter monitor, RVR with abberration  . SVT (supraventricular tachycardia)     Sx include palpitations, chest pain and near syncope. Pain located in substernal area.  . Ventricular tachycardia      ECHO 02/15/13 - EF between 55-60%  . Enlarged prostate     Past Surgical History  Procedure Laterality Date  . Colonoscopy N/A 02/23/2014    Procedure: COLONOSCOPY;  Surgeon: Beverley Fiedler, MD;  Location: [facility] ENDOSCOPY;  Service: Endoscopy;  Laterality: N/A;  ultraslim scope  . Enteroscopy N/A 02/23/2014    Procedure: ENTEROSCOPY;  Surgeon: Beverley Fiedler, MD;  Location: [facility] ENDOSCOPY;  Service: Endoscopy;  Laterality: N/A;  . Left heart catheterization with coronary angiogram N/A 02/21/2013    Procedure: LEFT HEART CATHETERIZATION WITH CORONARY ANGIOGRAM;  Surgeon: [name], MD;  Location: [facility] CATH LAB;  Service: Cardiovascular;  Laterality: N/A;    Family History  Problem Relation Age of Onset  . Hyperlipidemia Mother   . Hyperlipidemia Father   . Hyperlipidemia Brother   . Hypertension Mother   . Hypertension Father   . Hypertension Sister   . Hypertension Brother      History  Substance Use Topics  . Smoking status: Current Every Day Smoker -- 0.50 packs/day for 40 years    Types: Cigarettes  . Smokeless tobacco: Not on file  . Alcohol Use: Yes     Comment: occasional, drinks beer    Prior to Admission medications   Medication Sig Start Date End Date Taking? Authorizing Provider  albuterol-ipratropium (COMBIVENT) 18-103 MCG/ACT inhaler Inhale  1 puff into the lungs every 4 (four) hours.   Yes Historical Provider, MD  amLODipine-olmesartan (AZOR) 10-40 MG per tablet Take 1 tablet by mouth daily.   Yes Historical Provider, MD  Chlorphen-Pseudoephed-APAP (THERAFLU FLU/COLD PO) Take 1 packet by mouth at bedtime.   Yes Historical Provider, MD  ferrous sulfate 325 (65 FE) MG tablet Take 325 mg by mouth 2 (two) times daily with a meal.    Yes Historical Provider, MD  finasteride (PROSCAR) 5 MG tablet Take 5 mg by mouth daily as needed.    Yes Historical Provider, MD  Multiple Vitamin (MULTIVITAMIN WITH MINERALS) TABS tablet Take 1 tablet by mouth daily.   Yes Historical  Provider, MD  pantoprazole (PROTONIX) 40 MG tablet Take 1 tablet (40 mg total) by mouth daily at 6 (six) AM. Patient taking differently: Take 40 mg by mouth 2 (two) times daily as needed.  02/24/14  Yes Costin Otelia Sergeant, MD  tamsulosin (FLOMAX) 0.4 MG CAPS capsule Take 0.4 mg by mouth daily.   Yes Historical Provider, MD    Current Facility-Administered Medications  Medication Dose Route Frequency Provider Last Rate Last Dose  . 0.9 %  sodium chloride infusion   Intravenous Continuous Alison Murray, MD 75 mL/hr at 05/23/14 0734 75 mL/hr at 05/23/14 0734  . acetaminophen (TYLENOL) tablet 650 mg  650 mg Oral Q6H PRN Alison Murray, MD       Or  . acetaminophen (TYLENOL) suppository 650 mg  650 mg Rectal Q6H PRN Alison Murray, MD      . amLODipine (NORVASC) tablet 10 mg  10 mg Oral Daily Alison Murray, MD   10 mg at 05/23/14 1000   And  . irbesartan (AVAPRO) tablet 300 mg  300 mg Oral Daily Alison Murray, MD      . antiseptic oral rinse (CPC / CETYLPYRIDINIUM CHLORIDE 0.05%) solution 7 mL  7 mL Mouth Rinse q12n4p Alison Murray, MD      . chlorhexidine (PERIDEX) 0.12 % solution 15 mL  15 mL Mouth Rinse BID Alison Murray, MD   15 mL at 05/23/14 1039  . ferrous sulfate tablet 325 mg  325 mg Oral Q breakfast Alison Murray, MD   325 mg at 05/23/14 1039  . ipratropium-albuterol (DUONEB) 0.5-2.5 (3) MG/3ML nebulizer solution 3 mL  3 mL Nebulization Q6H Alison Murray, MD      . multivitamin with minerals tablet 1 tablet  1 tablet Oral Daily Alison Murray, MD   1 tablet at 05/23/14 1039  . ondansetron (ZOFRAN) tablet 4 mg  4 mg Oral Q6H PRN Alison Murray, MD       Or  . ondansetron [facility]) injection 4 mg  4 mg Intravenous Q6H PRN Alison Murray, MD      . pantoprazole (PROTONIX) injection 40 mg  40 mg Intravenous Q12H Alison Murray, MD   40 mg at 05/23/14 1040  . sodium chloride 0.9 % injection 3 mL  3 mL Intravenous Q12H Alison Murray, MD   3 mL at 05/23/14 1039  . tamsulosin (FLOMAX) capsule 0.4 mg  0.4  mg Oral Daily Alison Murray, MD   0.4 mg at 05/23/14 1039    Allergies as of 05/23/2014  . (No Known Allergies)    Review of Systems:    All systems reviewed and negative except where noted in HPI.    Physical Exam:  Vital signs in last 24 hours: Temp:  [  97.8 F (36.6 C)-98.1 F (36.7 C)] 97.8 F (36.6 C) (02/03 0800) Pulse Rate:  [54-82] 57 (02/03 1000) Resp:  [14-19] 16 (02/03 1000) BP: (82-113)/(50-71) 112/53 mmHg (02/03 1000) SpO2:  [95 %-100 %] 100 % (02/03 1000) Weight:  [125 lb (56.7 kg)-128 lb 12 oz (58.4 kg)] 128 lb 12 oz (58.4 kg) (02/03 0641) Last BM Date: 05/23/14 General:   Pleasant black male in NAD Head:  Normocephalic and atraumatic. Eyes:   No icterus.   Conjunctiva pink. Ears:  Normal auditory acuity. Neck:  Supple; no masses felt Lungs:  Respirations even and unlabored. Lungs clear to auscultation bilaterally.   No wheezes, crackles, or rhonchi.  Heart:  Regular rate and rhythm Abdomen:  Soft, nondistended, nontender. Normal bowel sounds. No appreciable masses or hepatomegaly.  Rectal:  No external lesions. No stool or blood in anal vault  Msk:  Symmetrical without gross deformities.  Extremities:  Without edema. Neurologic:  Alert and  oriented x4;  grossly normal neurologically. Skin:  Intact without significant lesions or rashes. Cervical Nodes:  No significant cervical adenopathy. Psych:  Alert and cooperative. Normal affect.  LAB RESULTS:  Recent Labs  05/23/14 0350  WBC 10.2  HGB 11.5*  HCT 34.3*  PLT 249   BMET  Recent Labs  05/23/14 0350  NA 138  K 4.3  CL 109  CO2 25  GLUCOSE 108*  BUN 26*  CREATININE 1.26  CALCIUM 8.6   LFT  Recent Labs  05/23/14 0350  PROT 7.1  ALBUMIN 4.1  AST 20  ALT 20  ALKPHOS 74  BILITOT 0.3    PREVIOUS ENDOSCOPIES:            see HPI   Impression / Plan:    69 year old male with recurrent rectal bleeding (really painless as he has chronic lower abdominal pain). Previous colonoscopy  negative. 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 Currently his hgb is at baseline, no active bleeding at time of rounds. Will monitor closely, may need enteroscopy depending on clinical course. This could also just be perianal bleeding, like from internal hemorrhoids  Thanks   LOS: 0 days   Willette ClusterPaula Egor Fullilove  05/23/2014, 11:18 AM

## 2014-05-23 NOTE — ED Provider Notes (Signed)
CSN: [id number]     Arrival date & time 05/23/14  0328 History   First MD Initiated Contact with Patient 05/23/14 0354     Chief Complaint  Patient presents with  . Rectal Bleeding     (Consider location/radiation/quality/duration/timing/severity/associated sxs/prior Treatment) HPI Complains of rectal bleeding gradual onset 2 weeks ago. He reports that the amount of gross blood in his stool increased tonight and he became lightheaded tonight. He also complains of mild lower abdominal pain for the past several weeks, unchanged. Not made better or worse by anything. He denies any chest pain .No increase in shortness of breath from his baseline. No other associated symptoms no treatment prior to coming here nothing makes symptoms better or worse. Past Medical History  Diagnosis Date  . Chest pain 02/2013    CP in left anterior and left lateral chest. Described as pressure-like & sharp. Pain is moderate & worsening. Sx exacerbated by lying down and spicy foods. Has associated heartburn, nausea & weakness.  . Carotid bruit   . Benign hypertension   . Syncope and collapse 02/2013    felt to be vasovagal.   . Tobacco abuse   . DOE (dyspnea on exertion)   . Acid reflux   . Carotid bruit   . Palpitations   . GI bleed 2014  . SOB (shortness of breath)   . Atrial fibrillation     01-2013 - identified on holter monitor, RVR with abberration  . SVT (supraventricular tachycardia)     Sx include palpitations, chest pain and near syncope. Pain located in substernal area.  . Ventricular tachycardia     ECHO 02/15/13 - EF between 55-60%  . Enlarged prostate    Past Surgical History  Procedure Laterality Date  . Colonoscopy N/A 02/23/2014    Procedure: COLONOSCOPY;  Surgeon: Beverley Fiedler, MD;  Location: [facility] ENDOSCOPY;  Service: Endoscopy;  Laterality: N/A;  ultraslim scope  . Enteroscopy N/A 02/23/2014    Procedure: ENTEROSCOPY;  Surgeon: Beverley Fiedler, MD;  Location: [facility] ENDOSCOPY;  Service:  Endoscopy;  Laterality: N/A;  . Left heart catheterization with coronary angiogram N/A 02/21/2013    Procedure: LEFT HEART CATHETERIZATION WITH CORONARY ANGIOGRAM;  Surgeon: [name], MD;  Location: [facility] CATH LAB;  Service: Cardiovascular;  Laterality: N/A;   Family History  Problem Relation Age of Onset  . Hyperlipidemia Mother   . Hyperlipidemia Father   . Hyperlipidemia Brother   . Hypertension Mother   . Hypertension Father   . Hypertension Sister   . Hypertension Brother    History  Substance Use Topics  . Smoking status: Current Every Day Smoker -- 0.50 packs/day for 40 years    Types: Cigarettes  . Smokeless tobacco: Not on file  . Alcohol Use: Yes     Comment: occasional, drinks beer    Review of Systems  Constitutional: Negative.   HENT: Negative.   Respiratory: Positive for shortness of breath.        Chronic dyspnea  Cardiovascular: Negative.   Gastrointestinal: Positive for abdominal pain and blood in stool.  Musculoskeletal: Negative.   Skin: Negative.   Neurological: Negative.   Psychiatric/Behavioral: Negative.   All other systems reviewed and are negative.     Allergies  Review of patient's allergies indicates no known allergies.  Home Medications   Prior to Admission medications   Medication Sig Start Date End Date Taking? Authorizing Provider  albuterol-ipratropium (COMBIVENT) 18-103 MCG/ACT inhaler Inhale 1 puff into the lungs every 4 (  four) hours.    Historical Provider, MD  amLODipine-olmesartan (AZOR) 10-40 MG per tablet Take 1 tablet by mouth daily.    Historical Provider, MD  ferrous sulfate 325 (65 FE) MG tablet Take 325 mg by mouth daily with breakfast.    Historical Provider, MD  finasteride (PROSCAR) 5 MG tablet Take 5 mg by mouth daily as needed.     Historical Provider, MD  Multiple Vitamin (MULTIVITAMIN WITH MINERALS) TABS tablet Take 1 tablet by mouth daily.    Historical Provider, MD  olmesartan (BENICAR) 40 MG tablet Take 40 mg  by mouth daily.    Historical Provider, MD  pantoprazole (PROTONIX) 40 MG tablet Take 1 tablet (40 mg total) by mouth daily at 6 (six) AM. 02/24/14   Costin Otelia SergeantM Gherghe, MD  tamsulosin (FLOMAX) 0.4 MG CAPS capsule Take 0.4 mg by mouth daily.    Historical Provider, MD   BP 84/51 mmHg  Pulse 81  Temp(Src) 98.1 F (36.7 C) (Oral)  Resp 14  Ht 5\' 7"  (1.702 m)  Wt 125 lb (56.7 kg)  BMI 19.57 kg/m2  SpO2 95% Physical Exam  Constitutional: He appears well-developed and well-nourished. No distress.  Hypotensive  HENT:  Head: Normocephalic and atraumatic.  Eyes: Conjunctivae are normal. Pupils are equal, round, and reactive to light.  Neck: Neck supple. No tracheal deviation present. No thyromegaly present.  Cardiovascular: Normal rate, regular rhythm and normal heart sounds.   No murmur heard. Bradycardic  Pulmonary/Chest: Effort normal and breath sounds normal.  Abdominal: Soft. Bowel sounds are normal. He exhibits no distension. There is no tenderness.  Genitourinary: Penis normal. Guaiac positive stool.  Maroon stool gross blood  Musculoskeletal: Normal range of motion. He exhibits no edema or tenderness.  Neurological: He is alert. Coordination normal.  Skin: Skin is warm and dry. No rash noted.  Psychiatric: He has a normal mood and affect.  Nursing note and vitals reviewed.   ED Course  Procedures (including critical care time) Labs Review Labs Reviewed  CBC  COMPREHENSIVE METABOLIC PANEL  POC OCCULT BLOOD, ED    Imaging Review No results found.   EKG Interpretation   Date/Time:  Wednesday May 23 2014 03:54:00 EST Ventricular Rate:  63 PR Interval:  118 QRS Duration: 66 QT Interval:  414 QTC Calculation: 423 R Axis:   63 Text Interpretation:  Normal sinus rhythm Possible Left atrial enlargement  Borderline ECG No significant change since last tracing Confirmed by  Emmalynn Pinkham  MD, Kynsie Falkner ([id number]) on 05/23/2014 4:02:49 AM     4:42 AM Patient's blood pressure  improved after treatment with 1 L normal saline. Second liter IV normal saline bolus ordered. At 5: 15 a.m. he is resting comfortably alert no distress. Results for orders placed or performed during the hospital encounter of 05/23/14  CBC  Result Value Ref Range   WBC 10.2 4.0 - 10.5 K/uL   RBC 3.82 (L) 4.22 - 5.81 MIL/uL   Hemoglobin 11.5 (L) 13.0 - 17.0 g/dL   HCT 16.134.3 (L) 09.639.0 - 04.552.0 %   MCV 89.8 78.0 - 100.0 fL   MCH 30.1 26.0 - 34.0 pg   MCHC 33.5 30.0 - 36.0 g/dL   RDW 40.913.3 81.111.5 - 91.415.5 %   Platelets 249 150 - 400 K/uL  Comprehensive metabolic panel  Result Value Ref Range   Sodium 138 135 - 145 mmol/L   Potassium 4.3 3.5 - 5.1 mmol/L   Chloride 109 96 - 112 mmol/L   CO2 25 19 -  32 mmol/L   Glucose, Bld 108 (H) 70 - 99 mg/dL   BUN 26 (H) 6 - 23 mg/dL   Creatinine, Ser 1.61 0.50 - 1.35 mg/dL   Calcium 8.6 8.4 - 09.6 mg/dL   Total Protein 7.1 6.0 - 8.3 g/dL   Albumin 4.1 3.5 - 5.2 g/dL   AST 20 0 - 37 U/L   ALT 20 0 - 53 U/L   Alkaline Phosphatase 74 39 - 117 U/L   Total Bilirubin 0.3 0.3 - 1.2 mg/dL   GFR calc non Af Amer 58 (L) >90 mL/min   GFR calc Af Amer 67 (L) >90 mL/min   Anion gap 4 (L) 5 - 15  Troponin I  Result Value Ref Range   Troponin I <0.03 <0.031 ng/mL  Occult blood card to lab, stool  Result Value Ref Range   Fecal Occult Bld POSITIVE (A) NEGATIVE   No results found.  MDM  Patient is hypotensive however he does not exhibit signs of shock clinically. Case discussed with Dr. Julian Reil Plan transfer to [facility] stepdown unit Diagnosis #1 GI bleeding #2 hypotension #3anemia #4 renal insufficiency Final diagnoses:  None    CRITICAL CARE Performed by: Doug Sou Total critical care time: 30 minute Critical care time was exclusive of separately billable procedures and treating other patients. Critical care was necessary to treat or prevent imminent or life-threatening deterioration. Critical care was time spent personally by me on  the following activities: development of treatment plan with patient and/or surrogate as well as nursing, discussions with consultants, evaluation of patient's response to treatment, examination of patient, obtaining history from patient or surrogate, ordering and performing treatments and interventions, ordering and review of laboratory studies, ordering and review of radiographic studies, pulse oximetry and re-evaluation of patient's condition.    Doug Sou, MD 05/23/14 [phone]

## 2014-05-24 LAB — COMPREHENSIVE METABOLIC PANEL
ALT: 16 U/L (ref 0–53)
AST: 14 U/L (ref 0–37)
Albumin: 3.2 g/dL — ABNORMAL LOW (ref 3.5–5.2)
Alkaline Phosphatase: 56 U/L (ref 39–117)
Anion gap: 4 — ABNORMAL LOW (ref 5–15)
BILIRUBIN TOTAL: 0.7 mg/dL (ref 0.3–1.2)
BUN: 12 mg/dL (ref 6–23)
CO2: 24 mmol/L (ref 19–32)
Calcium: 8 mg/dL — ABNORMAL LOW (ref 8.4–10.5)
Chloride: 113 mmol/L — ABNORMAL HIGH (ref 96–112)
Creatinine, Ser: 0.87 mg/dL (ref 0.50–1.35)
GFR calc Af Amer: >90 mL/min (ref >90)
GFR calc non Af Amer: 88 mL/min — ABNORMAL LOW (ref >90)
Glucose, Bld: 80 mg/dL (ref 70–99)
Potassium: 3.6 mmol/L (ref 3.5–5.1)
Sodium: 141 mmol/L (ref 135–145)
Total Protein: 5.4 g/dL — ABNORMAL LOW (ref 6.0–8.3)

## 2014-05-24 LAB — GLUCOSE, CAPILLARY: Glucose-Capillary: 130 mg/dL — ABNORMAL HIGH (ref 70–99)

## 2014-05-24 LAB — CBC
HCT: 29.5 % — ABNORMAL LOW (ref 39.0–52.0)
HEMOGLOBIN: 9.8 g/dL — AB (ref 13.0–17.0)
MCH: 30.5 pg (ref 26.0–34.0)
MCHC: 33.2 g/dL (ref 30.0–36.0)
MCV: 91.9 fL (ref 78.0–100.0)
Platelets: 203 10*3/uL (ref 150–400)
RBC: 3.21 MIL/uL — AB (ref 4.22–5.81)
RDW: 13.4 % (ref 11.5–15.5)
WBC: 5.5 10*3/uL (ref 4.0–10.5)

## 2014-05-24 MED ORDER — IPRATROPIUM-ALBUTEROL 0.5-2.5 (3) MG/3ML IN SOLN
3.0000 mL | Freq: Four times a day (QID) | RESPIRATORY_TRACT | Status: DC
  Administered 2014-05-24 – 2014-05-25 (×4): 3 mL via RESPIRATORY_TRACT
  Filled 2014-05-24 (×4): qty 3

## 2014-05-24 NOTE — Progress Notes (Signed)
    Progress Note   Subjective  No BMs or bleeding since prior to admission. Hungry. He does still have lower abdominal discomfort (chronic).     Objective   Vital signs in last 24 hours: Temp:  [97.7 F (36.5 C)-98.3 F (36.8 C)] 98.3 F (36.8 C) (02/04 0755) Pulse Rate:  [49-81] 81 (02/04 0800) Resp:  [12-18] 15 (02/04 0800) BP: (107-131)/(46-59) 131/51 mmHg (02/04 0800) SpO2:  [96 %-100 %] 98 % (02/04 0800) Weight:  [120 lb 13 oz (54.8 kg)] 120 lb 13 oz (54.8 kg) (02/04 0318) Last BM Date: 05/23/14 General:    Black male in NAD Heart:  Regular rate and rhythm Abdomen:  Soft, nontender and nondistended. Normal bowel sounds. Extremities:  Without edema. Neurologic:  Alert and oriented,  grossly normal neurologically. Psych:  Cooperative. Normal mood and affect.    Lab Results:  Recent Labs  05/23/14 0350 05/24/14 0350  WBC 10.2 5.5  HGB 11.5* 9.8*  HCT 34.3* 29.5*  PLT 249 203   BMET  Recent Labs  05/23/14 0350 05/24/14 0350  NA 138 141  K 4.3 3.6  CL 109 113*  CO2 25 24  GLUCOSE 108* 80  BUN 26* 12  CREATININE 1.26 0.87  CALCIUM 8.6 8.0*   LFT  Recent Labs  05/24/14 0350  PROT 5.4*  ALBUMIN 3.2*  AST 14  ALT 16  ALKPHOS 56  BILITOT 0.7     Assessment / Plan:   69 year old male with recurrent rectal bleeding (really painless as he has chronic lower abdominal pain).  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 No bleeding or BMs since prior to admission.  For unclear reasons Hgb rose from 9.7 on admission to 11.5 yesterday (without a transfusion). Today hgb 9.8 which is about at baseline. Not sure why the 11.5 hgb yesterday but doubt his return to baseline represent active bleeding. Patient asking for food. Will advance to solids.    LOS: 1 day   Willette Clusteraula Guenther  05/24/2014, 9:49 AM

## 2014-05-24 NOTE — Progress Notes (Signed)
Patient ID: Tyler Hunt, male   DOB: 03/03/1946, 69 y.o.   MRN: [id number] [facility] PROGRESS NOTE  Tyler MuslimJames Hunt [id number] DOB: 01/27/1946 DOA: 05/23/2014 PCP: Rocky MorelOSTAND, ROBERT, MD  Brief narrative:    69 year old male with past medical history of BPH, hypertension, small bowel AVM's (last colonoscopy and enteroscopy in 02/2014) specifically when he had enteroscopy in 02/2014 he did not have bleeding in the proximal jejunum (argon plasma coagulation was applied at that time). He now presented with blood per rectum with or without bowel movement. This has started about few days ago PTA but he decided to seek medical attention now since he felt lightheaded and almost passed out.   In ED, pt was found to have slight hypotension BP 82/50 but this has improved with IV fluids to 110/71. Blood work showed slight anemia and hemoglobin of 11.5, normal creatinine. He was admitted to SDU because of hypotension. GI has seen the patient in consultation  Assessment/Plan:    Principal Problem:  Acute GI bleeding / Acute blood loss anemia / iron deficiency anemia  - Patient has history of small bowel AVMs. His last colonoscopy and enteroscopy was in November 2015. - GI has seen the patient in consultation and recommended observation. - Hemoglobin drop noted in past 24 hours from 11.5 down to 9.8 which could potentially be dilutional from IV fludis. He did not have bleeding and did not have BM in past 24 hours. - Continue Protonix 40 mg IV every 12 hours. - Continue ferrous sulfate supplementation. - Patient is hemodynamically stable. Order placed for transfer to telemetry floor today.  Active Problems:  BPH (benign prostatic hyperplasia)  - continue Flomax daily   Essential hypertension  - holding norvasc and avapro until BP better. This morning blood pressure is 131/51.  DVT prophylaxis - SCDs bilaterally due to risk of bleeding   Code Status: Full.  Family Communication:  plan of care  discussed with the patient Disposition Plan: Patient is hemodynamically stable, transfer order placed for telemetry. The cause of drop in hemoglobin by 2 units in past 24 hours we will continue to observe patient for next one day and hopefully discharge home tomorrow 05/25/2014.  IV access:  Peripheral IV  Procedures and diagnostic studies:    No results found.  Medical Consultants:  Gastroenterology  Other Consultants:  None  IAnti-Infectives:   None    Manson PasseyEVINE, Syrita Dovel, MD  [facility] Pager [phone]  If 7PM-7AM, please contact night-coverage [email or web address] Password [email or web address] 05/24/2014, 9:41 AM   LOS: 1 day    HPI/Subjective: No acute overnight events.  Objective: Filed Vitals:   05/24/14 0743 05/24/14 0747 05/24/14 0755 05/24/14 0800  BP:    131/51  Pulse:    81  Temp:   98.3 F (36.8 C)   TempSrc:   Oral   Resp:    15  Height:      Weight:      SpO2: 98% 98%  98%    Intake/Output Summary (Last 24 hours) at 05/24/14 0941 Last data filed at 05/24/14 0600  Gross per 24 hour  Intake   2100 ml  Output   2850 ml  Net   -750 ml    Exam:   General:  Pt is alert, follows commands appropriately, not in acute distress  Cardiovascular: Regular rate and rhythm, S1/S2 (+)  Respiratory: Clear to auscultation bilaterally, no wheezing, no crackles, no rhonchi  Abdomen: Soft, non tender, non distended, bowel sounds present  Extremities: No  edema, pulses DP and PT palpable bilaterally  Neuro: Grossly nonfocal  Data Reviewed: Basic Metabolic Panel:  Recent Labs Lab 05/23/14 0350 05/24/14 0350  NA 138 141  K 4.3 3.6  CL 109 113*  CO2 25 24  GLUCOSE 108* 80  BUN 26* 12  CREATININE 1.26 0.87  CALCIUM 8.6 8.0*   Liver Function Tests:  Recent Labs Lab 05/23/14 0350 05/24/14 0350  AST 20 14  ALT 20 16  ALKPHOS 74 56  BILITOT 0.3 0.7  PROT 7.1 5.4*  ALBUMIN 4.1 3.2*   No results for input(s): LIPASE, AMYLASE in the last 168 hours. No results  for input(s): AMMONIA in the last 168 hours. CBC:  Recent Labs Lab 05/23/14 0350 05/24/14 0350  WBC 10.2 5.5  HGB 11.5* 9.8*  HCT 34.3* 29.5*  MCV 89.8 91.9  PLT 249 203   Cardiac Enzymes:  Recent Labs Lab 05/23/14 0400  TROPONINI <0.03   BNP: Invalid input(s): POCBNP CBG:  Recent Labs Lab 05/23/14 0816  GLUCAP 90    Recent Results (from the past 240 hour(s))  MRSA PCR Screening     Status: None   Collection Time: 05/23/14  6:48 AM  Result Value Ref Range Status   MRSA by PCR NEGATIVE NEGATIVE Final     Scheduled Meds: . ferrous sulfate  325 mg Oral Q breakfast  . ipratropium-albuterol  3 mL Nebulization Q6H  . multivitamin with minerals  1 tablet Oral Daily  . pantoprazole (PROTONIX) IV  40 mg Intravenous Q12H  . tamsulosin  0.4 mg Oral Daily

## 2014-05-25 LAB — CBC
HCT: 31 % — ABNORMAL LOW (ref 39.0–52.0)
Hemoglobin: 10.3 g/dL — ABNORMAL LOW (ref 13.0–17.0)
MCH: 30.4 pg (ref 26.0–34.0)
MCHC: 33.2 g/dL (ref 30.0–36.0)
MCV: 91.4 fL (ref 78.0–100.0)
Platelets: 243 10*3/uL (ref 150–400)
RBC: 3.39 MIL/uL — ABNORMAL LOW (ref 4.22–5.81)
RDW: 13.7 % (ref 11.5–15.5)
WBC: 6.4 10*3/uL (ref 4.0–10.5)

## 2014-05-25 LAB — GLUCOSE, CAPILLARY
Glucose-Capillary: 68 mg/dL — ABNORMAL LOW (ref 70–99)
Glucose-Capillary: 136 mg/dL — ABNORMAL HIGH (ref 70–99)
Glucose-Capillary: 98 mg/dL (ref 70–99)

## 2014-05-25 NOTE — Discharge Instructions (Signed)
Bloody Stools  Bloody stools often mean that there is a problem in the digestive tract. Your caregiver may use the term "melena" to describe black, tarry, and bad smelling stools or "hematochezia" to describe red or maroon-colored stools. Blood seen in the stool can be caused by bleeding anywhere along the intestinal tract.   A black stool usually means that blood is coming from the upper part of the gastrointestinal tract (esophagus, stomach, or small bowel). Passing maroon-colored stools or bright red blood usually means that blood is coming from lower down in the large bowel or the rectum. However, sometimes massive bleeding in the stomach or small intestine can cause bright red bloody stools.   Consuming black licorice, lead, iron pills, medicines containing bismuth subsalicylate, or blueberries can also cause black stools. Your caregiver can test black stools to see if blood is present.  It is important that the cause of the bleeding be found. Treatment can then be started, and the problem can be corrected. Rectal bleeding may not be serious, but you should not assume everything is okay until you know the cause. It is very important to follow up with your caregiver or a specialist in gastrointestinal problems.  CAUSES   Blood in the stools can come from various underlying causes. Often, the cause is not found during your first visit. Testing is often needed to discover the cause of bleeding in the gastrointestinal tract. Causes range from simple to serious or even life-threatening. Possible causes include:  · Hemorrhoids. These are veins that are full of blood (engorged) in the rectum. They cause pain, inflammation, and may bleed.  · Anal fissures. These are areas of painful tearing which may bleed. They are often caused by passing hard stool.  · Diverticulosis. These are pouches that form on the colon over time, with age, and may bleed significantly.  · Diverticulitis. This is inflammation in areas with  diverticulosis. It can cause pain, fever, and bloody stools, although bleeding is rare.  · Proctitis and colitis. These are inflamed areas of the rectum or colon. They may cause pain, fever, and bloody stools.  · Polyps and cancer. Colon cancer is a leading cause of preventable cancer death. It often starts out as precancerous polyps that can be removed during a colonoscopy, preventing progression into cancer. Sometimes, polyps and cancer may cause rectal bleeding.  · Gastritis and ulcers. Bleeding from the upper gastrointestinal tract (near the stomach) may travel through the intestines and produce black, sometimes tarry, often bad smelling stools. In certain cases, if the bleeding is fast enough, the stools may not be black, but red and the condition may be life-threatening.  SYMPTOMS   You may have stools that are bright red and bloody, that are normal color with blood on them, or that are dark black and tarry. In some cases, you may only have blood in the toilet bowl. Any of these cases need medical care. You may also have:  · Pain at the anus or anywhere in the rectum.  · Lightheadedness or feeling faint.  · Extreme weakness.  · Nausea or vomiting.  · Fever.  DIAGNOSIS  Your caregiver may use the following methods to find the cause of your bleeding:  · Taking a medical history. Age is important. Older people tend to develop polyps and cancer more often. If there is anal pain and a hard, large stool associated with bleeding, a tear of the anus may be the cause. If blood drips into the toilet after a bowel movement, bleeding hemorrhoids may be the   problem. The color and frequency of the bleeding are additional considerations. In most cases, the medical history provides clues, but seldom the final answer.  · A visual and finger (digital) exam. Your caregiver will inspect the anal area, looking for tears and hemorrhoids. A finger exam can provide information when there is tenderness or a growth inside. In men, the  prostate is also examined.  · Endoscopy. Several types of small, long scopes (endoscopes) are used to view the colon.  ¨ In the office, your caregiver may use a rigid, or more commonly, a flexible viewing sigmoidoscope. This exam is called flexible sigmoidoscopy. It is performed in 5 to 10 minutes.  ¨ A more thorough exam is accomplished with a colonoscope. It allows your caregiver to view the entire 5 to 6 foot long colon. Medicine to help you relax (sedative) is usually given for this exam. Frequently, a bleeding lesion may be present beyond the reach of the sigmoidoscope. So, a colonoscopy may be the best exam to start with. Both exams are usually done on an outpatient basis. This means the patient does not stay overnight in the hospital or surgery center.  ¨ An upper endoscopy may be needed to examine your stomach. Sedation is used and a flexible endoscope is put in your mouth, down to your stomach.  · A barium enema X-ray. This is an X-ray exam. It uses liquid barium inserted by enema into the rectum. This test alone may not identify an actual bleeding point. X-rays highlight abnormal shadows, such as those made by lumps (tumors), diverticuli, or colitis.  TREATMENT   Treatment depends on the cause of your bleeding.   · For bleeding from the stomach or colon, the caregiver doing your endoscopy or colonoscopy may be able to stop the bleeding as part of the procedure.  · Inflammation or infection of the colon can be treated with medicines.  · Many rectal problems can be treated with creams, suppositories, or warm baths.  · Surgery is sometimes needed.  · Blood transfusions are sometimes needed if you have lost a lot of blood.  · For any bleeding problem, let your caregiver know if you take aspirin or other blood thinners regularly.  HOME CARE INSTRUCTIONS   · Take any medicines exactly as prescribed.  · Keep your stools soft by eating a diet high in fiber. Prunes (1 to 3 a day) work well for many people.  · Drink  enough water and fluids to keep your urine clear or pale yellow.  · Take sitz baths if advised. A sitz bath is when you sit in a bathtub with warm water for 10 to 15 minutes to soak, soothe, and cleanse the rectal area.  · If enemas or suppositories are advised, be sure you know how to use them. Tell your caregiver if you have problems with this.  · Monitor your bowel movements to look for signs of improvement or worsening.  SEEK MEDICAL CARE IF:   · You do not improve in the time expected.  · Your condition worsens after initial improvement.  · You develop any new symptoms.  SEEK IMMEDIATE MEDICAL CARE IF:   · You develop severe or prolonged rectal bleeding.  · You vomit blood.  · You feel weak or faint.  · You have a fever.  MAKE SURE YOU:  · Understand these instructions.  · Will watch your condition.  · Will get help right away if you are not doing well or get worse.    Document Released: 03/27/2002 Document Revised: 06/29/2011 Document Reviewed: 08/22/2010  ExitCare® Patient Information ©2015 [employer]. This information is not intended to replace advice given to you by your health care provider. Make sure you discuss any questions you have with your health care provider.

## 2014-05-25 NOTE — Discharge Summary (Signed)
Physician Discharge Summary  Tyler Hunt WUJ:[id number] DOB: 16-Apr-1946 DOA: 05/23/2014  PCP: [name], MD  Admit date: 05/23/2014 Discharge date: 05/25/2014  Recommendations for Outpatient Follow-up:  1. Continue Protonix as previously prescribed. 2. Please follow-up with primary care physician in about one week after discharge to make sure symptoms are controlled. 3. Your hemoglobin is 10.3 prior to discharge. 4. Patient did not require blood transfusion throughout the hospital stay.  Discharge Diagnoses:  Principal Problem:   Acute GI bleeding Active Problems:   Acute blood loss anemia   BPH (benign prostatic hyperplasia)   Essential hypertension   Angiodysplasia of gastrointestinal tract    Discharge Condition: stable   Diet recommendation: as tolerated   History of present illness:  69 year old male with past medical history of BPH, hypertension, small bowel AVM's (last colonoscopy and enteroscopy in 02/2014) specifically when he had enteroscopy in 02/2014 he did not have bleeding in the proximal jejunum (argon plasma coagulation was applied at that time). He now presented with blood per rectum with or without bowel movement. This has started about few days ago PTA but he decided to seek medical attention now since he felt lightheaded and almost passed out.   In ED, pt was found to have slight hypotension BP 82/50 but this has improved with IV fluids to 110/71. Blood work showed slight anemia and hemoglobin of 11.5, normal creatinine. He was admitted to SDU because of hypotension. GI has seen the patient in consultation.  Assessment/Plan:    Principal Problem:  Acute GI bleeding / Acute blood loss anemia / iron deficiency anemia  - Patient has history of small bowel AVMs. His last colonoscopy and enteroscopy was in November 2015. - Drop in hemoglobin noted from 11.5 down to 9.8. His hemoglobin this morning is 10.3. Patient did not receive blood transfusion  throughout the hospital stay. - GI has seen the patient in consultation. - Patient was on Protonix 40 mg IV every 12 hours. On discharge he will continue Protonix as previously prescribed. - Patient can continue ferrous sulfate supplementation.   Active Problems:  BPH (benign prostatic hyperplasia)  - continue Flomax daily, Proscar daily as needed  Essential hypertension  - Since blood pressure was on soft side on the admission we held patient's blood pressure medication. Blood pressure is 129/65 this morning. He can resume his blood pressure medications on discharge.  DVT prophylaxis - SCDs bilaterally due to risk of bleeding   Code Status: Full.  Family Communication: plan of care discussed with the patient   IV access:  Peripheral IV  Procedures and diagnostic studies:   No results found.  Medical Consultants:  Gastroenterology  Other Consultants:  None  IAnti-Infectives:   None   Signed:  Manson Passey, MD  [facility] 05/25/2014, 9:34 AM  Pager #: [phone]   Discharge Exam: Filed Vitals:   05/25/14 0625  BP: 129/65  Pulse: 56  Temp: 98.3 F (36.8 C)  Resp: 18   Filed Vitals:   05/24/14 2155 05/25/14 0206 05/25/14 0625 05/25/14 0751  BP: 133/62  129/65   Pulse: 100  56   Temp: 98.5 F (36.9 C)  98.3 F (36.8 C)   TempSrc: Oral  Oral   Resp: 18  18   Height:      Weight:      SpO2: 100% 98% 99% 97%    General: Pt is alert, follows commands appropriately, not in acute distress Cardiovascular: Regular rate and rhythm, S1/S2 +, no murmurs  Respiratory: Clear to auscultation bilaterally, no wheezing, no crackles, no rhonchi Abdominal: Soft, non tender, non distended, bowel sounds +, no guarding Extremities: no edema, no cyanosis, pulses palpable bilaterally DP and PT Neuro: Grossly nonfocal  Discharge Instructions  Discharge Instructions    Call MD for:  difficulty breathing, headache or visual disturbances     Complete by:  As directed      Call MD for:  persistant dizziness or light-headedness    Complete by:  As directed      Call MD for:  persistant nausea and vomiting    Complete by:  As directed      Call MD for:  severe uncontrolled pain    Complete by:  As directed      Diet - low sodium heart healthy    Complete by:  As directed      Discharge instructions    Complete by:  As directed   1. Continue Protonix as previously prescribed. 2. Please follow-up with primary care physician in about one week after discharge to make sure symptoms are controlled. 3. Your hemoglobin is 10.3 prior to discharge. 4. Patient did not require blood transfusion throughout the hospital stay.     Increase activity slowly    Complete by:  As directed             Medication List    TAKE these medications        albuterol-ipratropium 18-103 MCG/ACT inhaler  Commonly known as:  COMBIVENT  Inhale 1 puff into the lungs every 4 (four) hours.     AZOR 10-40 MG per tablet  Generic drug:  amLODipine-olmesartan  Take 1 tablet by mouth daily.     ferrous sulfate 325 (65 FE) MG tablet  Take 325 mg by mouth 2 (two) times daily with a meal.     finasteride 5 MG tablet  Commonly known as:  PROSCAR  Take 5 mg by mouth daily as needed.     multivitamin with minerals Tabs tablet  Take 1 tablet by mouth daily.     pantoprazole 40 MG tablet  Commonly known as:  PROTONIX  Take 1 tablet (40 mg total) by mouth daily at 6 (six) AM.     tamsulosin 0.4 MG Caps capsule  Commonly known as:  FLOMAX  Take 0.4 mg by mouth daily.     THERAFLU FLU/COLD PO  Take 1 packet by mouth at bedtime.           Follow-up Information    Follow up with Rocky MorelOSTAND, ROBERT, MD. Schedule an appointment as soon as possible for a visit in 1 week.   Specialty:  Internal Medicine   Why:  Follow up appt after recent hospitalization   Contact information:   [address] [place] [id number] [phone]        The results  of significant diagnostics from this hospitalization (including imaging, microbiology, ancillary and laboratory) are listed below for reference.    Significant Diagnostic Studies: No results found.  Microbiology: Recent Results (from the past 240 hour(s))  MRSA PCR Screening     Status: None   Collection Time: 05/23/14  6:48 AM  Result Value Ref Range Status   MRSA by PCR NEGATIVE NEGATIVE Final     Labs: Basic Metabolic Panel:  Recent Labs Lab 05/23/14 0350 05/24/14 0350  NA 138 141  K 4.3 3.6  CL 109 113*  CO2 25 24  GLUCOSE 108* 80  BUN 26* 12  CREATININE 1.26 0.87  CALCIUM 8.6 8.0*   Liver Function Tests:  Recent Labs Lab 05/23/14 0350 05/24/14 0350  AST 20 14  ALT 20 16  ALKPHOS 74 56  BILITOT 0.3 0.7  PROT 7.1 5.4*  ALBUMIN 4.1 3.2*   No results for input(s): LIPASE, AMYLASE in the last 168 hours. No results for input(s): AMMONIA in the last 168 hours. CBC:  Recent Labs Lab 05/23/14 0350 05/24/14 0350 05/25/14 0540  WBC 10.2 5.5 6.4  HGB 11.5* 9.8* 10.3*  HCT 34.3* 29.5* 31.0*  MCV 89.8 91.9 91.4  PLT 249 203 243   Cardiac Enzymes:  Recent Labs Lab 05/23/14 0400  TROPONINI <0.03   BNP: BNP (last 3 results) No results for input(s): BNP in the last 8760 hours.  ProBNP (last 3 results) No results for input(s): PROBNP in the last 8760 hours.  CBG:  Recent Labs Lab 05/23/14 0816 05/24/14 2212 05/25/14 0739  GLUCAP 90 130* 98    Time coordinating discharge: Over 30 minutes

## 2014-05-25 NOTE — Progress Notes (Signed)
Patient being discharged home with instructions and work note. IV cath removed and intact. No pain/swelling at site. No c/o pian at this time. Patient awaiting for ride and transport down stairs.

## 2014-06-11 ENCOUNTER — Encounter: Payer: Self-pay | Admitting: Internal Medicine

## 2017-05-06 ENCOUNTER — Emergency Department (HOSPITAL_BASED_OUTPATIENT_CLINIC_OR_DEPARTMENT_OTHER): Payer: Medicare HMO

## 2017-05-06 ENCOUNTER — Emergency Department (HOSPITAL_BASED_OUTPATIENT_CLINIC_OR_DEPARTMENT_OTHER)
Admission: EM | Admit: 2017-05-06 | Discharge: 2017-05-06 | Disposition: A | Discharge status: Home / Self Care | Payer: Medicare HMO | Attending: Emergency Medicine | Admitting: Emergency Medicine

## 2017-05-06 ENCOUNTER — Encounter (HOSPITAL_BASED_OUTPATIENT_CLINIC_OR_DEPARTMENT_OTHER): Payer: Self-pay | Admitting: *Deleted

## 2017-05-06 ENCOUNTER — Other Ambulatory Visit: Payer: Self-pay

## 2017-05-06 DIAGNOSIS — R0602 Shortness of breath: Secondary | ICD-10-CM | POA: Diagnosis present

## 2017-05-06 DIAGNOSIS — I10 Essential (primary) hypertension: Secondary | ICD-10-CM | POA: Insufficient documentation

## 2017-05-06 DIAGNOSIS — F17210 Nicotine dependence, cigarettes, uncomplicated: Secondary | ICD-10-CM | POA: Diagnosis not present

## 2017-05-06 DIAGNOSIS — Z79899 Other long term (current) drug therapy: Secondary | ICD-10-CM | POA: Insufficient documentation

## 2017-05-06 DIAGNOSIS — J441 Chronic obstructive pulmonary disease with (acute) exacerbation: Secondary | ICD-10-CM

## 2017-05-06 LAB — CBC WITH DIFFERENTIAL/PLATELET
BASOS PCT: 0 %
Basophils Absolute: 0 10*3/uL (ref 0.0–0.1)
EOS PCT: 5 %
Eosinophils Absolute: 0.4 10*3/uL (ref 0.0–0.7)
HEMATOCRIT: 39 % (ref 39.0–52.0)
Hemoglobin: 13.3 g/dL (ref 13.0–17.0)
Lymphocytes Relative: 22 %
Lymphs Abs: 1.5 10*3/uL (ref 0.7–4.0)
MCH: 30.6 pg (ref 26.0–34.0)
MCHC: 34.1 g/dL (ref 30.0–36.0)
MCV: 89.7 fL (ref 78.0–100.0)
MONO ABS: 0.5 10*3/uL (ref 0.1–1.0)
MONOS PCT: 8 %
NEUTROS ABS: 4.5 10*3/uL (ref 1.7–7.7)
Neutrophils Relative %: 65 %
Platelets: 225 10*3/uL (ref 150–400)
RBC: 4.35 MIL/uL (ref 4.22–5.81)
RDW: 13.1 % (ref 11.5–15.5)
WBC: 6.9 10*3/uL (ref 4.0–10.5)

## 2017-05-06 LAB — BASIC METABOLIC PANEL
Anion gap: 8 (ref 5–15)
BUN: 15 mg/dL (ref 6–20)
CALCIUM: 9.2 mg/dL (ref 8.9–10.3)
CO2: 26 mmol/L (ref 22–32)
CREATININE: 0.91 mg/dL (ref 0.61–1.24)
Chloride: 105 mmol/L (ref 101–111)
Glucose, Bld: 102 mg/dL — ABNORMAL HIGH (ref 65–99)
Potassium: 3.9 mmol/L (ref 3.5–5.1)
SODIUM: 139 mmol/L (ref 135–145)

## 2017-05-06 LAB — TROPONIN I: Troponin I: <0.03 ng/mL (ref <0.03)

## 2017-05-06 MED ORDER — ALBUTEROL SULFATE HFA 108 (90 BASE) MCG/ACT IN AERS
INHALATION_SPRAY | RESPIRATORY_TRACT | Status: AC
  Administered 2017-05-06: 12:00:00
  Filled 2017-05-06: qty 6.7

## 2017-05-06 MED ORDER — ALBUTEROL SULFATE (2.5 MG/3ML) 0.083% IN NEBU
2.5000 mg | INHALATION_SOLUTION | Freq: Once | RESPIRATORY_TRACT | Status: DC
  Filled 2017-05-06: qty 3

## 2017-05-06 MED ORDER — IPRATROPIUM BROMIDE 0.02 % IN SOLN
RESPIRATORY_TRACT | Status: AC
  Administered 2017-05-06: 0.5 mg
  Filled 2017-05-06: qty 2.5

## 2017-05-06 MED ORDER — ALBUTEROL (5 MG/ML) CONTINUOUS INHALATION SOLN
INHALATION_SOLUTION | RESPIRATORY_TRACT | Status: AC
  Administered 2017-05-06: 3 mg/h
  Filled 2017-05-06: qty 20

## 2017-05-06 MED ORDER — METHYLPREDNISOLONE SODIUM SUCC 125 MG IJ SOLR
125.0000 mg | Freq: Once | INTRAMUSCULAR | Status: AC
  Administered 2017-05-06: 125 mg via INTRAVENOUS
  Filled 2017-05-06: qty 2

## 2017-05-06 MED ORDER — PREDNISONE 10 MG PO TABS: 40.0000 mg | ORAL_TABLET | Freq: Every day | ORAL | 0 refills | Status: DC

## 2017-05-06 MED FILL — predniSONE 10 MG TABS: 10 | 4 days supply | Qty: 16 | Fill #0

## 2017-05-06 NOTE — ED Provider Notes (Signed)
[facility] EMERGENCY DEPARTMENT Provider Note   CSN: [id number] Arrival date & time: 05/06/17  0930     History   Chief Complaint Chief Complaint  Patient presents with  . Shortness of Breath    HPI Tyler Hunt is a 72 y.o. male.  The history is provided by the patient. No language interpreter was used.  Shortness of Breath    Tyler Hunt is a 72 y.o. male who presents to the Emergency Department complaining of sob.  Reports 2 weeks of progressive shortness of breath with dyspnea on exertion.  He has a cough productive of clear and white sputum.  No chest pain, fevers, nausea, vomiting, leg swelling or pain.  He has been using his Combivent and albuterol inhalers with no significant improvement in his symptoms.  He is a former smoker and occasionally uses marijuana.  No alcohol.  Symptoms are moderate, constant, worsening. Past Medical History:  Diagnosis Date  . Acid reflux   . Atrial fibrillation (HCC)    01-2013 - identified on holter monitor, RVR with abberration  . Benign hypertension   . Carotid bruit   . Carotid bruit   . Chest pain 02/2013   CP in left anterior and left lateral chest. Described as pressure-like & sharp. Pain is moderate & worsening. Sx exacerbated by lying down and spicy foods. Has associated heartburn, nausea & weakness.  . DOE (dyspnea on exertion)   . Enlarged prostate   . GI bleed 2014  . Palpitations   . SOB (shortness of breath)   . SVT (supraventricular tachycardia) (HCC)    Sx include palpitations, chest pain and near syncope. Pain located in substernal area.  . Syncope and collapse 02/2013   felt to be vasovagal.   . Tobacco abuse   . Ventricular tachycardia (HCC)    ECHO 02/15/13 - EF between 55-60%    Patient Active Problem List   Diagnosis Date Noted  . Acute GI bleeding   . Acute blood loss anemia   . BPH (benign prostatic hyperplasia)   . Essential hypertension   . Angiodysplasia of gastrointestinal tract      Past Surgical History:  Procedure Laterality Date  . COLONOSCOPY N/A 02/23/2014   Procedure: COLONOSCOPY;  Surgeon: Beverley Fiedler, MD;  Location[facility] ENDOSCOPY;  Service: Endoscopy;  Laterality: N/A;  ultraslim scope  . ENTEROSCOPY N/A 02/23/2014   Procedure: ENTEROSCOPY;  Surgeon: Beverley Fiedler, MD;  Location[facility] ENDOSCOPY;  Service: Endoscopy;  Laterality: N/A;  . LEFT HEART CATHETERIZATION WITH CORONARY ANGIOGRAM N/A 02/21/2013   Procedure: LEFT HEART CATHETERIZATION WITH CORONARY ANGIOGRAM;  Surgeon: [name], MD;  Location: [facility] CATH LAB;  Service: Cardiovascular;  Laterality: N/A;       Home Medications    Prior to Admission medications   Medication Sig Start Date End Date Taking? Authorizing Provider  albuterol-ipratropium (COMBIVENT) 18-103 MCG/ACT inhaler Inhale 1 puff into the lungs every 4 (four) hours.    [provider]  amLODipine-olmesartan (AZOR) 10-40 MG per tablet Take 1 tablet by mouth daily.    [provider]  Chlorphen-Pseudoephed-APAP (THERAFLU FLU/COLD PO) Take 1 packet by mouth at bedtime.    [provider]  ferrous sulfate 325 (65 FE) MG tablet Take 325 mg by mouth 2 (two) times daily with a meal.     [provider]  finasteride (PROSCAR) 5 MG tablet Take 5 mg by mouth daily as needed.     [provider]  Multiple Vitamin (  MULTIVITAMIN WITH MINERALS) TABS tablet Take 1 tablet by mouth daily.    [provider]  pantoprazole (PROTONIX) 40 MG tablet Take 1 tablet (40 mg total) by mouth daily at 6 (six) AM. Patient taking differently: Take 40 mg by mouth 2 (two) times daily as needed.  02/24/14   Leatha Gilding, MD  predniSONE (DELTASONE) 10 MG tablet Take 4 tablets (40 mg total) by mouth daily. 05/06/17   Tilden Fossa, MD  tamsulosin (FLOMAX) 0.4 MG CAPS capsule Take 0.4 mg by mouth daily.    [provider]    Family History Family History  Problem Relation Age of Onset  .  Hyperlipidemia Mother   . Hypertension Mother   . Hyperlipidemia Father   . Hypertension Father   . Hyperlipidemia Brother   . Hypertension Sister   . Hypertension Brother     Social History Social History   Tobacco Use  . Smoking status: Current Every Day Smoker    Packs/day: 0.50    Years: 40.00    Pack years: 20.00    Types: Cigarettes  . Smokeless tobacco: Never Used  Substance Use Topics  . Alcohol use: Yes    Comment: occasional, drinks beer  . Drug use: Not on file     Allergies   Patient has no known allergies.   Review of Systems Review of Systems  Respiratory: Positive for shortness of breath.   All other systems reviewed and are negative.    Physical Exam Updated Vital Signs BP 128/72   Pulse 72   Temp 97.6 F (36.4 C) (Oral)   Resp 20   SpO2 92%   Physical Exam  Constitutional: He is oriented to person, place, and time. He appears well-developed and well-nourished.  HENT:  Head: Normocephalic and atraumatic.  Cardiovascular: Normal rate and regular rhythm.  No murmur heard. Pulmonary/Chest:  Tachypnea with diffuse wheezes and rhonchi.  Abdominal: Soft. There is no tenderness. There is no rebound and no guarding.  Musculoskeletal: He exhibits no edema or tenderness.  Neurological: He is alert and oriented to person, place, and time.  Skin: Skin is warm and dry.  Psychiatric: He has a normal mood and affect. His behavior is normal.  Nursing note and vitals reviewed.    ED Treatments / Results  Labs (all labs ordered are listed, but only abnormal results are displayed) Labs Reviewed  BASIC METABOLIC PANEL - Abnormal; Notable for the following components:      Result Value   Glucose, Bld 102 (*)    All other components within normal limits  TROPONIN I  CBC WITH DIFFERENTIAL/PLATELET    EKG  EKG Interpretation  Date/Time:  Thursday May 06 2017 09:40:03 EST Ventricular Rate:  64 PR Interval:    QRS Duration: 72 QT  Interval:  419 QTC Calculation: 436 R Axis:   79 Text Interpretation:  Sinus rhythm baseline wander II, II,m aVF, V4 Confirmed by Tilden Fossa [phone]) on 05/06/2017 10:11:13 AM       Radiology Dg Chest 2 View  Result Date: 05/06/2017 CLINICAL DATA:  Several weeks of shortness of breath. Discontinued smoking 8 months ago. EXAM: CHEST  2 VIEW COMPARISON:  None in PACs FINDINGS: The lungs are hyperinflated with hemidiaphragm flattening. The interstitial markings are coarse. There is no alveolar infiltrate or pleural effusion. The heart and pulmonary vascularity are normal. There calcification in the wall of the aortic arch. The bony thorax exhibits no acute abnormality. IMPRESSION: COPD. No pneumonia, CHF, nor other  acute cardiopulmonary abnormality. Thoracic aortic atherosclerosis. Electronically Signed   By: David  SwazilandJordan M.D.   On: 05/06/2017 10:53    Procedures Procedures (including critical care time)  Medications Ordered in ED Medications  albuterol (PROVENTIL) (2.5 MG/3ML) 0.083% nebulizer solution 2.5 mg (not administered)  albuterol (PROVENTIL HFA;VENTOLIN HFA) 108 (90 Base) MCG/ACT inhaler (not administered)  albuterol (PROVENTIL, VENTOLIN) (5 MG/ML) 0.5% continuous inhalation solution (3 mg/hr  Given 05/06/17 0940)  ipratropium (ATROVENT) 0.02 % nebulizer solution (0.5 mg  Given 05/06/17 0940)  methylPREDNISolone sodium succinate (SOLU-MEDROL) 125 mg/2 mL injection 125 mg (125 mg Intravenous Given 05/06/17 1003)     Initial Impression / Assessment and Plan / ED Course  I have reviewed the triage vital signs and the nursing notes.  Pertinent labs & imaging results that were available during my care of the patient were reviewed by me and considered in my medical decision making (see chart for details).     Patient with history of chronic lung disease here with increased shortness of breath for the last 2 weeks.  Patient with tachypnea and diffuse wheezes and rhonchi on initial  evaluation.  On repeat assessment following albuterol treatment he is feeling significantly improved.  There is no accessory muscle use and there is good air movement bilaterally with occasional rhonchi.  Presentation is not consistent with CHF, ACS, PE, pneumonia.  Patient is able to ambulate the department without difficulty on repeat assessment.  Counseled patient on home care for COPD exacerbation with continued inhalers and instructed in use.  Providing prescription for prednisone burst.  Discussed outpatient follow-up and return precautions.  Final Clinical Impressions(s) / ED Diagnoses   Final diagnoses:  COPD exacerbation [facility](HCC)    ED Discharge Orders        Ordered    predniSONE (DELTASONE) 10 MG tablet  Daily     05/06/17 1214       Tilden Fossaees, Randy Castrejon, MD 05/06/17 1216

## 2017-05-06 NOTE — ED Notes (Signed)
Pt ambulated in ED without any difficulty. HR 110, RR 18, SpO2 93-94%. MD made aware. Pt states "I feel much better."

## 2017-05-06 NOTE — ED Notes (Signed)
15mg Albuterol CAT complete.   

## 2017-05-06 NOTE — ED Triage Notes (Signed)
Pt reports 2 weeks of sob with productive cough, little relief with using his inhaler at home. Pt with doe noted, rt is at bedside, aerosol treatment initiated.

## 2017-06-13 ENCOUNTER — Emergency Department (HOSPITAL_BASED_OUTPATIENT_CLINIC_OR_DEPARTMENT_OTHER): Payer: Medicare HMO

## 2017-06-13 ENCOUNTER — Emergency Department (HOSPITAL_BASED_OUTPATIENT_CLINIC_OR_DEPARTMENT_OTHER)
Admission: EM | Admit: 2017-06-13 | Discharge: 2017-06-14 | Disposition: A | Discharge status: Home / Self Care | Payer: Medicare HMO | Attending: Emergency Medicine | Admitting: Emergency Medicine

## 2017-06-13 DIAGNOSIS — Z79899 Other long term (current) drug therapy: Secondary | ICD-10-CM | POA: Insufficient documentation

## 2017-06-13 DIAGNOSIS — R0602 Shortness of breath: Secondary | ICD-10-CM

## 2017-06-13 DIAGNOSIS — I471 Supraventricular tachycardia: Secondary | ICD-10-CM | POA: Diagnosis not present

## 2017-06-13 DIAGNOSIS — F17210 Nicotine dependence, cigarettes, uncomplicated: Secondary | ICD-10-CM | POA: Diagnosis not present

## 2017-06-13 DIAGNOSIS — I4891 Unspecified atrial fibrillation: Secondary | ICD-10-CM | POA: Diagnosis not present

## 2017-06-13 DIAGNOSIS — J209 Acute bronchitis, unspecified: Secondary | ICD-10-CM | POA: Diagnosis not present

## 2017-06-13 DIAGNOSIS — J40 Bronchitis, not specified as acute or chronic: Secondary | ICD-10-CM

## 2017-06-13 MED ORDER — ALBUTEROL SULFATE HFA 108 (90 BASE) MCG/ACT IN AERS
2.0000 | INHALATION_SPRAY | RESPIRATORY_TRACT | Status: DC | PRN
  Administered 2017-06-14: 2 via RESPIRATORY_TRACT
  Filled 2017-06-13: qty 6.7

## 2017-06-13 MED ORDER — ALBUTEROL SULFATE (2.5 MG/3ML) 0.083% IN NEBU
5.0000 mg | INHALATION_SOLUTION | Freq: Once | RESPIRATORY_TRACT | Status: AC
  Administered 2017-06-13: 5 mg via RESPIRATORY_TRACT
  Filled 2017-06-13: qty 6

## 2017-06-13 NOTE — ED Triage Notes (Signed)
Pt presents with SOB x 1 day. Has inhalers with him "not working". O2 sats 88-93% on ra. Denies pain. Eval by RT in triage

## 2017-06-14 MED ORDER — ALBUTEROL SULFATE HFA 108 (90 BASE) MCG/ACT IN AERS: 2.0000 | INHALATION_SPRAY | RESPIRATORY_TRACT | 0 refills | Status: DC | PRN

## 2017-06-14 MED ORDER — DEXAMETHASONE SODIUM PHOSPHATE 10 MG/ML IJ SOLN
10.0000 mg | Freq: Once | INTRAMUSCULAR | Status: AC
  Administered 2017-06-14: 10 mg via INTRAMUSCULAR
  Filled 2017-06-14: qty 1

## 2017-06-14 MED ORDER — DOXYCYCLINE HYCLATE 100 MG PO CAPS: 100.0000 mg | ORAL_CAPSULE | Freq: Two times a day (BID) | ORAL | 0 refills | Status: DC

## 2017-06-14 NOTE — Discharge Instructions (Signed)
You were seen today for shortness of breath.  This may be related to undiagnosed COPD.  You had some wheezing on exam.  You are given an inhaler.  You are also given steroids.  Will be discharged with a short course of doxycycline.  Follow-up with your primary physician if symptoms do not improve.

## 2017-06-14 NOTE — ED Provider Notes (Signed)
[facility] EMERGENCY DEPARTMENT Provider Note   CSN: [id number] Arrival date & time: 06/13/17  2220     History   Chief Complaint Chief Complaint  Patient presents with  . Shortness of Breath    HPI Tyler MuslimJames Belongia is a 72 y.o. male.  HPI  This is a [age] year old male with a history of atrial fibrillation, dyspnea on exertion, SVT, tobacco abuse who presents with shortness of breath.  Patient reports he had worsening dyspnea on exertion today.  At baseline he has some issues with dyspnea on exertion.  He states "I think I have COPD but have not been diagnosed."  He was seen here 1 month ago and treated for the same.  He states that his inhaler had been helping him but that it "broke."  He denies any recent fevers.  He does report a nonproductive cough.  Denies any chest pain.  Denies any lower extremity swelling or history of blood clots.  She reports he feels much better after receiving a breathing treatment in triage.  Past Medical History:  Diagnosis Date  . Acid reflux   . Atrial fibrillation (HCC)    01-2013 - identified on holter monitor, RVR with abberration  . Benign hypertension   . Carotid bruit   . Carotid bruit   . Chest pain 02/2013   CP in left anterior and left lateral chest. Described as pressure-like & sharp. Pain is moderate & worsening. Sx exacerbated by lying down and spicy foods. Has associated heartburn, nausea & weakness.  . DOE (dyspnea on exertion)   . Enlarged prostate   . GI bleed 2014  . Palpitations   . SOB (shortness of breath)   . SVT (supraventricular tachycardia) (HCC)    Sx include palpitations, chest pain and near syncope. Pain located in substernal area.  . Syncope and collapse 02/2013   felt to be vasovagal.   . Tobacco abuse   . Ventricular tachycardia (HCC)    ECHO 02/15/13 - EF between 55-60%    Patient Active Problem List   Diagnosis Date Noted  . Acute GI bleeding   . Acute blood loss anemia   . BPH (benign prostatic  hyperplasia)   . Essential hypertension   . Angiodysplasia of gastrointestinal tract     Past Surgical History:  Procedure Laterality Date  . COLONOSCOPY N/A 02/23/2014   Procedure: COLONOSCOPY;  Surgeon: Beverley FiedlerJay M Pyrtle, MD;  [facility];  Service: Endoscopy;  Laterality: N/A;  ultraslim scope  . ENTEROSCOPY N/A 02/23/2014   Procedure: ENTEROSCOPY;  Surgeon: Beverley FiedlerJay M Pyrtle, MD;  [facility];  Service: Endoscopy;  Laterality: N/A;  . LEFT HEART CATHETERIZATION WITH CORONARY ANGIOGRAM N/A 02/21/2013   Procedure: LEFT HEART CATHETERIZATION WITH CORONARY ANGIOGRAM;  Surgeon: Runell GessJonathan J Berry, MD;  [facility];  Service: Cardiovascular;  Laterality: N/A;       Home Medications    Prior to Admission medications   Medication Sig Start Date End Date Taking? Authorizing Provider  albuterol (PROVENTIL HFA;VENTOLIN HFA) 108 (90 Base) MCG/ACT inhaler Inhale 2 puffs into the lungs every 4 (four) hours as needed for wheezing or shortness of breath. 06/14/17   Shayda Kalka, Mayer Maskerourtney F, MD  albuterol-ipratropium (COMBIVENT) 18-103 MCG/ACT inhaler Inhale 1 puff into the lungs every 4 (four) hours.    [provider]  amLODipine-olmesartan (AZOR) 10-40 MG per tablet Take 1 tablet by mouth daily.    [provider]  Chlorphen-Pseudoephed-APAP (THERAFLU FLU/COLD PO) Take 1 packet by mouth  at bedtime.    [provider]  doxycycline (VIBRAMYCIN) 100 MG capsule Take 1 capsule (100 mg total) by mouth 2 (two) times daily. 06/14/17   Kennieth Plotts, Mayer Masker, MD  ferrous sulfate 325 (65 FE) MG tablet Take 325 mg by mouth 2 (two) times daily with a meal.     [provider]  finasteride (PROSCAR) 5 MG tablet Take 5 mg by mouth daily as needed.     [provider]  Multiple Vitamin (MULTIVITAMIN WITH MINERALS) TABS tablet Take 1 tablet by mouth daily.    [provider]  pantoprazole (PROTONIX) 40 MG tablet Take 1 tablet (40 mg total) by mouth daily  at 6 (six) AM. Patient taking differently: Take 40 mg by mouth 2 (two) times daily as needed.  02/24/14   Leatha Gilding, MD  predniSONE (DELTASONE) 10 MG tablet Take 4 tablets (40 mg total) by mouth daily. 05/06/17   Tilden Fossa, MD  tamsulosin (FLOMAX) 0.4 MG CAPS capsule Take 0.4 mg by mouth daily.    [provider]    Family History Family History  Problem Relation Age of Onset  . Hyperlipidemia Mother   . Hypertension Mother   . Hyperlipidemia Father   . Hypertension Father   . Hyperlipidemia Brother   . Hypertension Sister   . Hypertension Brother     Social History Social History   Tobacco Use  . Smoking status: Current Every Day Smoker    Packs/day: 0.50    Years: 40.00    Pack years: 20.00    Types: Cigarettes  . Smokeless tobacco: Never Used  Substance Use Topics  . Alcohol use: Yes    Comment: occasional, drinks beer  . Drug use: Not on file     Allergies   Patient has no known allergies.   Review of Systems Review of Systems  Constitutional: Negative for fever.  Respiratory: Positive for cough, shortness of breath and wheezing.   Cardiovascular: Negative for chest pain and leg swelling.  Gastrointestinal: Negative for abdominal pain, nausea and vomiting.  Neurological: Negative for headaches.  All other systems reviewed and are negative.    Physical Exam Updated Vital Signs Pulse 92   Temp 98.9 F (37.2 C) (Oral)   Resp (!) 26   Ht 5\' 7"  (1.702 m)   Wt 54.4 kg (120 lb)   SpO2 94% Comment: post breathing tx  BMI 18.79 kg/m   Physical Exam  Constitutional: He is oriented to person, place, and time. He appears well-developed and well-nourished. He does not appear ill.  HENT:  Head: Normocephalic and atraumatic.  Cardiovascular: Normal rate, regular rhythm and normal heart sounds.  No murmur heard. Pulmonary/Chest: Effort normal and breath sounds normal. No respiratory distress. He has no wheezes.  Fair air movement, scant  wheezing noted mostly left lung base  Abdominal: Soft. Bowel sounds are normal. There is no tenderness. There is no rebound.  Musculoskeletal: He exhibits no edema.       Right lower leg: He exhibits no edema.       Left lower leg: He exhibits no edema.  Lymphadenopathy:    He has no cervical adenopathy.  Neurological: He is alert and oriented to person, place, and time.  Skin: Skin is warm and dry.  Psychiatric: He has a normal mood and affect.  Nursing note and vitals reviewed.    ED Treatments / Results  Labs (all labs ordered are listed, but only abnormal results are displayed) Labs Reviewed -  No data to display  EKG  EKG Interpretation  Date/Time:  Sunday June 13 2017 22:25:08 EST Ventricular Rate:  92 PR Interval:  122 QRS Duration: 76 QT Interval:  328 QTC Calculation: 405 R Axis:   90 Text Interpretation:  Normal sinus rhythm Biatrial enlargement Rightward axis Pulmonary disease pattern Abnormal ECG Confirmed by Lateesha Bezold ([id number]) on 06/14/2017 12:22:33 AM       Radiology Dg Chest 2 View  Result Date: 06/13/2017 CLINICAL DATA:  71 year old male with shortness of breath cough. EXAM: CHEST  2 VIEW COMPARISON:  Chest radiograph dated 05/06/2017 FINDINGS: The heart size and mediastinal contours are within normal limits. Both lungs are clear. The visualized skeletal structures are unremarkable. IMPRESSION: No active cardiopulmonary disease. Electronically Signed   By: Arash  Radparvar M.D.   On: 06/13/2017 23:28    Procedures Procedures (including critical care time)  Medications Ordered in ED Medications  albuterol (PROVENTIL HFA;VENTOLIN HFA) 108 (90 Base) MCG/ACT inhaler 2 puff (2 puffs Inhalation Given 06/14/17 0002)  dexamethasone (DECADRON) injection 10 mg (not administered)  albuterol (PROVENTIL) (2.5 MG/3ML) 0.083% nebulizer solution 5 mg (5 mg Nebulization Given 06/13/17 2241)     Initial Impression / Assessment and Plan / ED Course  I have  reviewed the triage vital signs and the nursing notes.  Pertinent labs & imaging results that were available during my care of the patient were reviewed by me and considered in my medical decision making (see chart for details).     Patient presents with shortness of breath.  Denies infectious symptoms.  Does report nonproductive cough.  He is afebrile.  Initial O2 sats 88-93% on room air.  He is 91% while I am in the room.  He does have scant wheezing on exam but states he feels much improved after neb.  Patient was provided with an additional inhaler.  We will treat for presumed undiagnosed COPD and exacerbation.  Patient was given IM Decadron.  Chest x-ray is negative.  EKG is reassuring.  He was able to ambulate and maintain pulse ox greater than 91%.  Patient feels much better.  Will discharge home with albuterol and doxycycline for presumed COPD exacerbation.  After history, exam, and medical workup I feel the patient has been appropriately medically screened and is safe for discharge home. Pertinent diagnoses were discussed with the patient. Patient was given return precautions.   Final Clinical Impressions(s) / ED Diagnoses   Final diagnoses:  SOB (shortness of breath)  Bronchitis    ED Discharge Orders        Ordered    albuterol (PROVENTIL HFA;VENTOLIN HFA) 108 (90 Base) MCG/ACT inhaler  Every 4 hours PRN     06/14/17 0106    doxycycline (VIBRAMYCIN) 100 MG capsule  2 times daily     02 /25/19 0106       Andros Channing, Mayer Masker, MD 06/14/17 0110

## 2017-06-14 NOTE — ED Notes (Signed)
Pt ambulated to bathroom. Sats remained at 91% HR 102. Pt in no distress.

## 2018-04-14 IMAGING — CR DG CHEST 2V
2 series · 2 of 2 positions shown · non-contrast
Comparison: Chest radiograph dated 05/06/2017

CLINICAL DATA: 71-year-old male with shortness of breath cough.

EXAM:
CHEST  2 VIEW

[w chest pa]
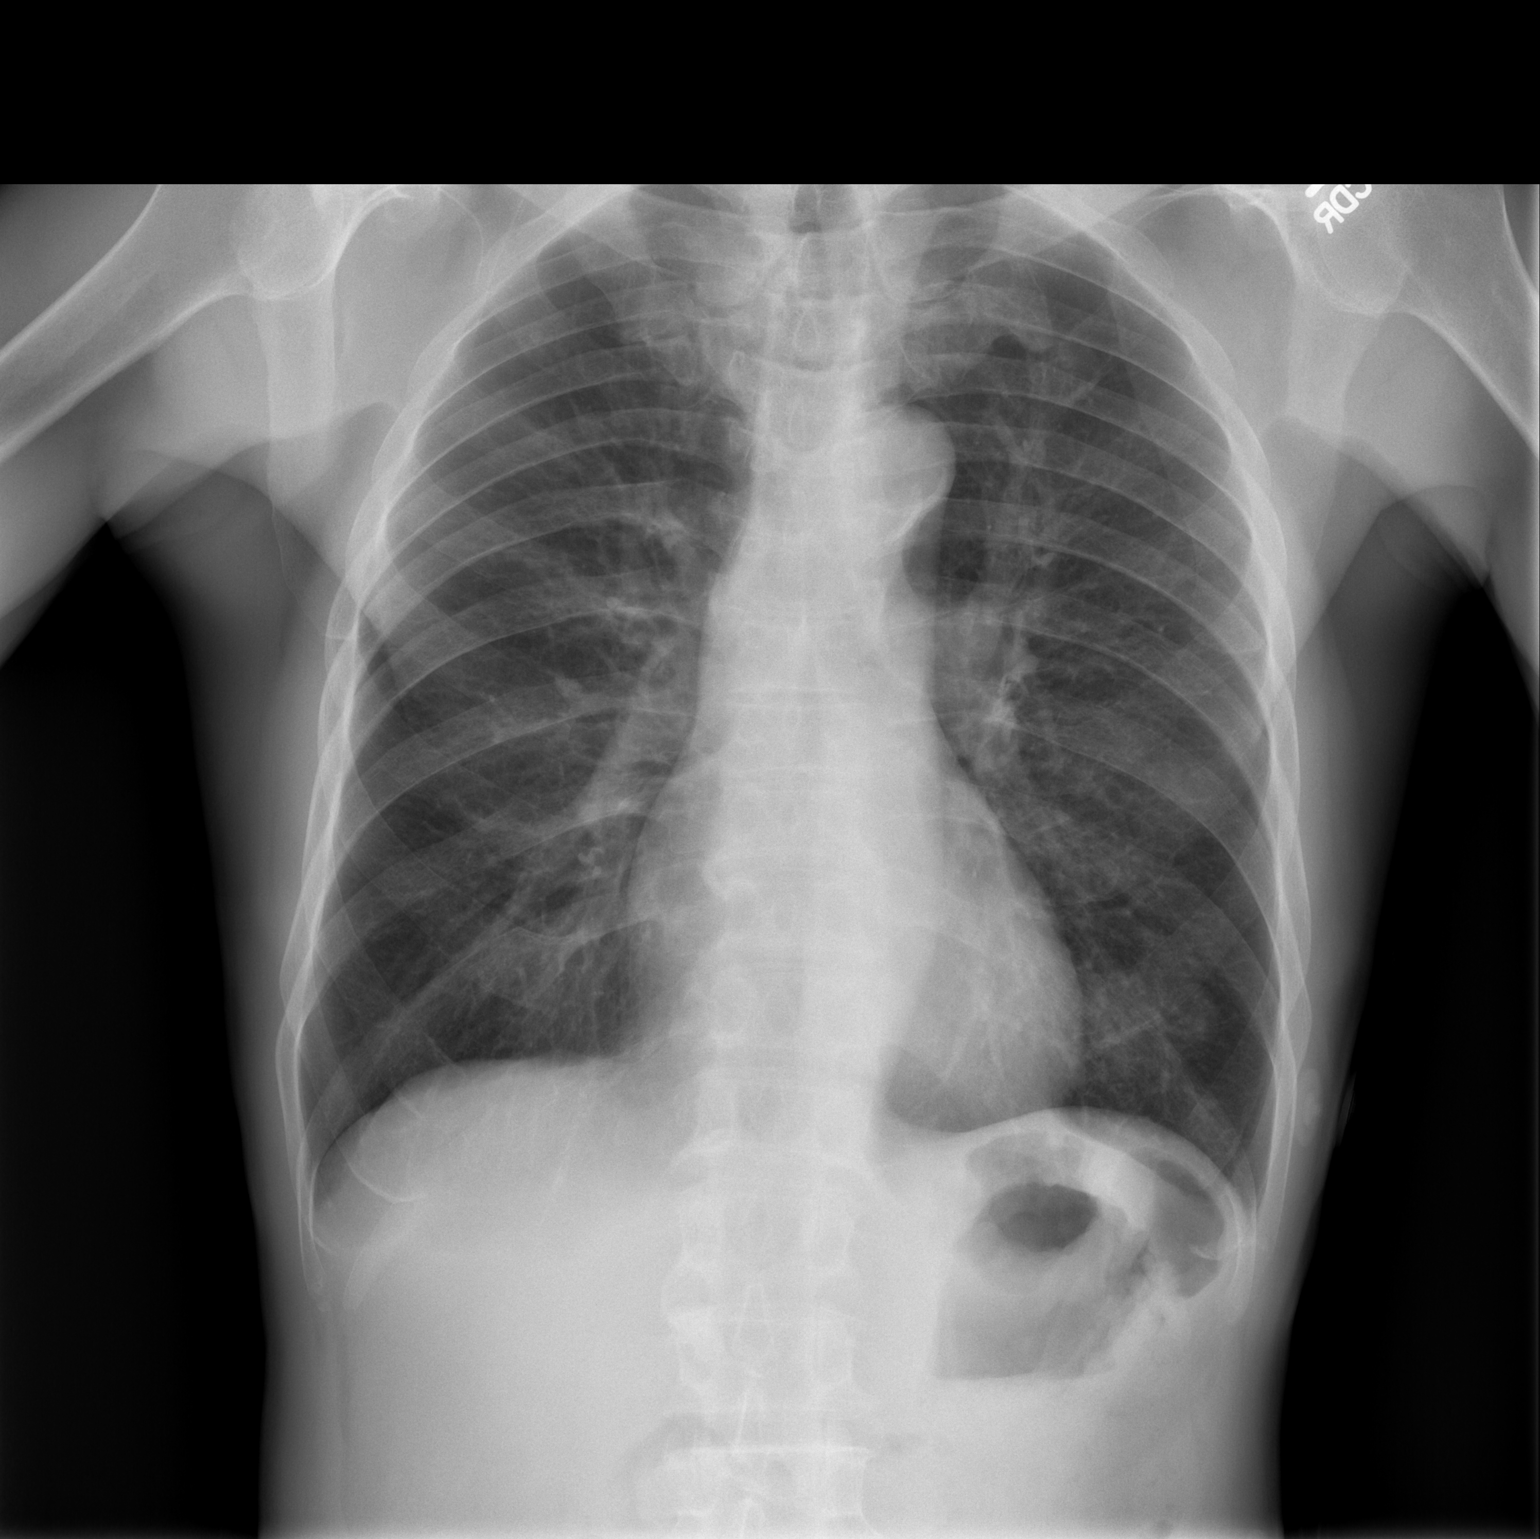

[w chest lat]
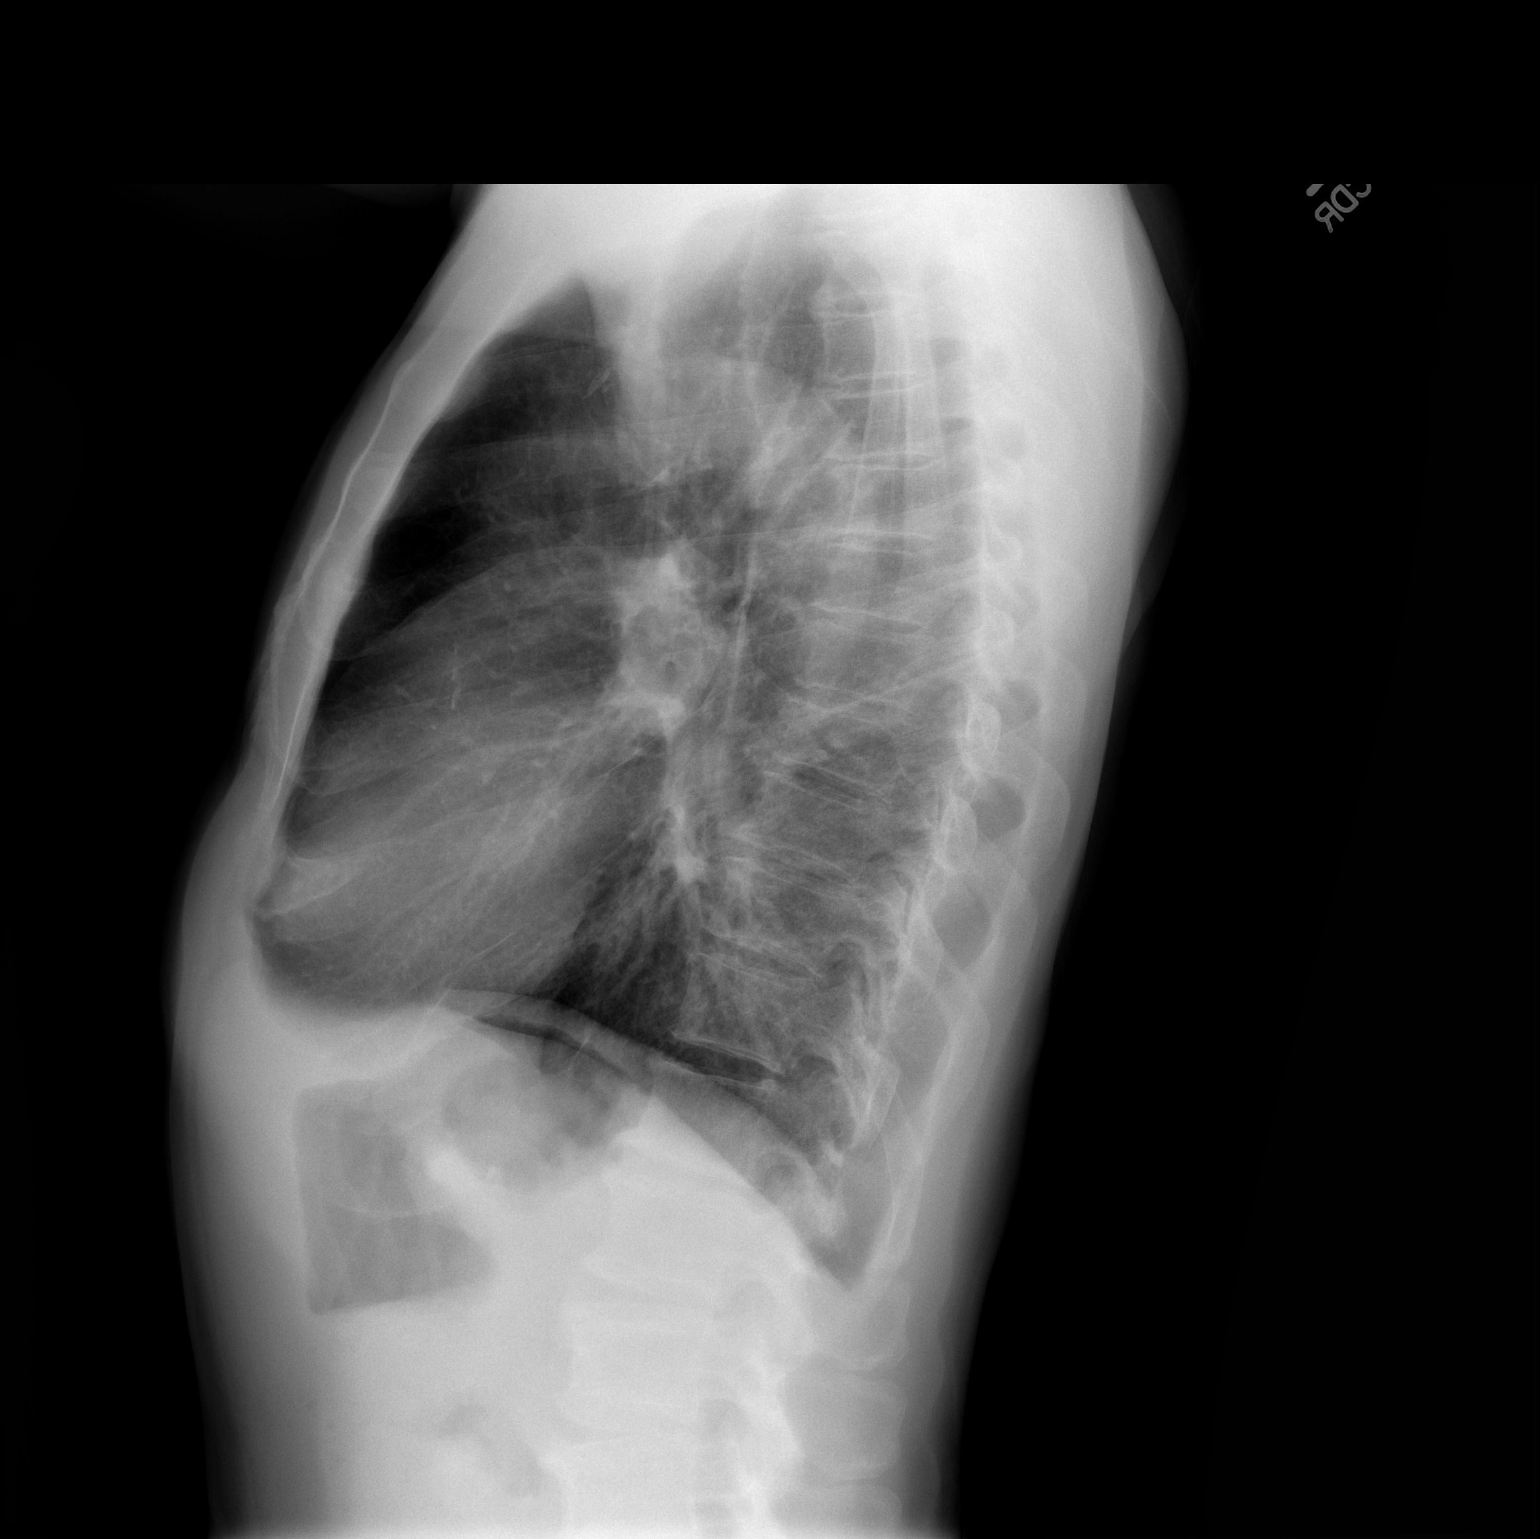

[2 of 2 positions shown; findings below may reference images not displayed]

FINDINGS: The heart size and mediastinal contours are within normal limits.
Both lungs are clear. The visualized skeletal structures are
unremarkable.
IMPRESSION: No active cardiopulmonary disease.

## 2019-04-11 ENCOUNTER — Telehealth: Payer: Self-pay | Admitting: Internal Medicine

## 2019-04-11 NOTE — Telephone Encounter (Signed)
Pt is not a pt at our office. I see no note where anyone from our office had tried to call pt. Called pt but unable to reach. Left detailed message for pt. Nothing further needed.

## 2021-05-19 ENCOUNTER — Emergency Department (HOSPITAL_BASED_OUTPATIENT_CLINIC_OR_DEPARTMENT_OTHER): Payer: Medicare (Managed Care)

## 2021-05-19 ENCOUNTER — Encounter (HOSPITAL_BASED_OUTPATIENT_CLINIC_OR_DEPARTMENT_OTHER): Payer: Self-pay

## 2021-05-19 ENCOUNTER — Other Ambulatory Visit: Payer: Self-pay

## 2021-05-19 ENCOUNTER — Emergency Department (HOSPITAL_BASED_OUTPATIENT_CLINIC_OR_DEPARTMENT_OTHER)
Admission: EM | Admit: 2021-05-19 | Discharge: 2021-05-19 | Disposition: A | Discharge status: Home / Self Care | Payer: Medicare (Managed Care) | Attending: Emergency Medicine | Admitting: Emergency Medicine

## 2021-05-19 DIAGNOSIS — J449 Chronic obstructive pulmonary disease, unspecified: Secondary | ICD-10-CM | POA: Insufficient documentation

## 2021-05-19 DIAGNOSIS — R079 Chest pain, unspecified: Secondary | ICD-10-CM | POA: Diagnosis not present

## 2021-05-19 DIAGNOSIS — Z79899 Other long term (current) drug therapy: Secondary | ICD-10-CM | POA: Insufficient documentation

## 2021-05-19 DIAGNOSIS — I10 Essential (primary) hypertension: Secondary | ICD-10-CM | POA: Diagnosis not present

## 2021-05-19 DIAGNOSIS — Z8553 Personal history of malignant neoplasm of renal pelvis: Secondary | ICD-10-CM | POA: Insufficient documentation

## 2021-05-19 LAB — CBC
HCT: 39.9 % (ref 39.0–52.0)
Hemoglobin: 13.3 g/dL (ref 13.0–17.0)
MCH: 31.1 pg (ref 26.0–34.0)
MCHC: 33.3 g/dL (ref 30.0–36.0)
MCV: 93.2 fL (ref 80.0–100.0)
Platelets: 224 10*3/uL (ref 150–400)
RBC: 4.28 MIL/uL (ref 4.22–5.81)
RDW: 13.8 % (ref 11.5–15.5)
WBC: 11.2 10*3/uL — ABNORMAL HIGH (ref 4.0–10.5)
nRBC: 0 % (ref 0.0–0.2)

## 2021-05-19 LAB — BASIC METABOLIC PANEL
Anion gap: 5 (ref 5–15)
BUN: 29 mg/dL — ABNORMAL HIGH (ref 8–23)
CO2: 27 mmol/L (ref 22–32)
Calcium: 10.1 mg/dL (ref 8.9–10.3)
Chloride: 107 mmol/L (ref 98–111)
Creatinine, Ser: 1.03 mg/dL (ref 0.61–1.24)
GFR, Estimated: >60 mL/min (ref >60)
Glucose, Bld: 100 mg/dL — ABNORMAL HIGH (ref 70–99)
Potassium: 4.7 mmol/L (ref 3.5–5.1)
Sodium: 139 mmol/L (ref 135–145)

## 2021-05-19 LAB — TROPONIN I (HIGH SENSITIVITY)
Troponin I (High Sensitivity): 7 ng/L (ref <18)
Troponin I (High Sensitivity): 9 ng/L (ref <18)

## 2021-05-19 NOTE — ED Triage Notes (Signed)
Pt arrives with c/o burning in his chest states that he went to his PCP about 3 weeks ago for the same states he was placed on medication for heart burn but does not feel that is what he has. Pain is left sided, and worse with movement. No radiation. One episode of lightheadedness about 3-4 days ago states it only happened one time.

## 2021-05-19 NOTE — ED Provider Notes (Signed)
[facility] EMERGENCY DEPARTMENT Provider Note   CSN: [id number] Arrival date & time: 05/19/21  0940     History  Chief Complaint  Patient presents with   Chest Pain    Tyler Hunt is a 76 y.o. male.  HPI     76 year old male with history of atrial fibrillation, renal cell carcinoma, COPD, hypertension, history of bradycardia and atrial tachycardia seen by Dr. Clydie Braun who presents with concern for chest pain.  2 weeks ago saw PCP, started on omeprazole 20mg  once a day  Having a little more than a month of pain, burning left side of chest, worse with movement, sitting up, turning to the side. Every time turn can feel it.  If laying still can't feel it but if turning left right forward or leaning back can feel it.  Left arm feels numb when first wake up in the AM, still can't make a tight grip, likely for a few months.  No neck pain.    No dyspnea, nausea, vomiting, diaphoresis  No leg pain or swelling, no recent surgeries or immobilation, not on anticoagulation, long trips car/airplane   Had radiation for RCC, probably 3 years ago in [place]     Past Medical History:  Diagnosis Date   Acid reflux    Atrial fibrillation ([place])    01-2013 - identified on holter monitor, RVR with abberration   Benign hypertension    Carotid bruit    Carotid bruit    Chest pain 02/2013   CP in left anterior and left lateral chest. Described as pressure-like & sharp. Pain is moderate & worsening. Sx exacerbated by lying down and spicy foods. Has associated heartburn, nausea & weakness.   DOE (dyspnea on exertion)    Enlarged prostate    GI bleed 2014   Palpitations    SOB (shortness of breath)    SVT (supraventricular tachycardia) (HCC)    Sx include palpitations, chest pain and near syncope. Pain located in substernal area.   Syncope and collapse 02/2013   felt to be vasovagal.    Tobacco abuse    Ventricular tachycardia    ECHO 02/15/13 - EF between 55-60%     Past Surgical History:  Procedure Laterality Date   COLONOSCOPY N/A 02/23/2014   Procedure: COLONOSCOPY;  Surgeon: Jerene Bears, MD;  [facility] ENDOSCOPY;  Service: Endoscopy;  Laterality: N/A;  ultraslim scope   ENTEROSCOPY N/A 02/23/2014   Procedure: ENTEROSCOPY;  Surgeon: Jerene Bears, MD;  Location: [facility] ENDOSCOPY;  Service: Endoscopy;  Laterality: N/A;   LEFT HEART CATHETERIZATION WITH CORONARY ANGIOGRAM N/A 02/21/2013   Procedure: LEFT HEART CATHETERIZATION WITH CORONARY ANGIOGRAM;  Surgeon: Lorretta Harp, MD;  Location: [facility] CATH LAB;  Service: Cardiovascular;  Laterality: N/A;    Home Medications Prior to Admission medications   Medication Sig Start Date End Date Taking? Authorizing Provider  albuterol (PROVENTIL HFA;VENTOLIN HFA) 108 (90 Base) MCG/ACT inhaler Inhale 2 puffs into the lungs every 4 (four) hours as needed for wheezing or shortness of breath. 06/14/17   Horton, Barbette Hair, MD  albuterol-ipratropium (COMBIVENT) 18-103 MCG/ACT inhaler Inhale 1 puff into the lungs every 4 (four) hours.    [provider]  amLODipine (NORVASC) 5 MG tablet Take 5 mg by mouth daily. 03/21/21   [provider]  atorvastatin (LIPITOR) 10 MG tablet Take 10 mg by mouth daily. 03/14/21   [provider]  cyclobenzaprine (FLEXERIL) 10 MG tablet Take 10 mg by mouth at bedtime. 01/06/21  [provider]  ferrous sulfate 325 (65 FE) MG tablet Take 325 mg by mouth 2 (two) times daily with a meal.     [provider]  finasteride (PROSCAR) 5 MG tablet Take 5 mg by mouth daily as needed.     [provider]  gabapentin (NEURONTIN) 300 MG capsule Take 300 mg by mouth 3 (three) times daily. 01/22/21   [provider]  losartan (COZAAR) 25 MG tablet Take 25 mg by mouth daily. 05/10/21   [provider]  meloxicam (MOBIC) 15 MG tablet Take 15 mg by mouth daily. 01/06/21   [provider]  Multiple Vitamin (MULTIVITAMIN WITH MINERALS)  TABS tablet Take 1 tablet by mouth daily.    [provider]  omeprazole (PRILOSEC) 20 MG capsule Take 20 mg by mouth daily. 04/23/21   [provider]  tamsulosin (FLOMAX) 0.4 MG CAPS capsule Take 0.4 mg by mouth daily.    [provider]      Allergies    Patient has no known allergies.    Review of Systems   Review of Systems See above  Physical Exam Updated Vital Signs BP (!) 144/73    Pulse (!) 53    Temp 98.1 F (36.7 C) (Oral)    Resp (!) 21    Ht 5\' 7"  (1.702 m)    Wt 54.4 kg    SpO2 100%    BMI 18.79 kg/m  Physical Exam Vitals and nursing note reviewed.  Constitutional:      General: He is not in acute distress.    Appearance: He is well-developed. He is not diaphoretic.  HENT:     Head: Normocephalic and atraumatic.  Eyes:     Conjunctiva/sclera: Conjunctivae normal.  Cardiovascular:     Rate and Rhythm: Normal rate and regular rhythm.     Heart sounds: Normal heart sounds. No murmur heard.   No friction rub. No gallop.  Pulmonary:     Effort: Pulmonary effort is normal. No respiratory distress.     Breath sounds: Normal breath sounds. No wheezing or rales.  Abdominal:     General: There is no distension.     Palpations: Abdomen is soft.     Tenderness: There is no abdominal tenderness. There is no guarding.  Musculoskeletal:     Cervical back: Normal range of motion.     Comments: Normal bilateral pulses, normal strength wrist extension, opponens, grip, flexion,extension elbow and shoulder abduction, numbness thumb, index, middle finger  Skin:    General: Skin is warm and dry.  Neurological:     Mental Status: He is alert and oriented to person, place, and time.    ED Results / Procedures / Treatments   Labs (all labs ordered are listed, but only abnormal results are displayed) Labs Reviewed  BASIC METABOLIC PANEL - Abnormal; Notable for the following components:      Result Value   Glucose, Bld 100 (*)    BUN 29 (*)    All  other components within normal limits  CBC - Abnormal; Notable for the following components:   WBC 11.2 (*)    All other components within normal limits  TROPONIN I (HIGH SENSITIVITY)  TROPONIN I (HIGH SENSITIVITY)    EKG EKG Interpretation  Date/Time:  Monday May 19 2021 09:51:33 EST Ventricular Rate:  132 PR Interval:  114 QRS Duration: 180 QT Interval:  174 QTC Calculation: 257 R Axis:   94 Text Interpretation: Normal sinus rhythm (disagree  with vent rate) Non-specific intra-ventricular conduction block Minimal voltage criteria for LVH, may be normal variant ( Cornell product ) Abnormal ECG When compared with ECG of 13-Jun-2017 22:25, Similar peaked TW to previous, more exaggerated Confirmed by Gareth Morgan [phone]) on 05/19/2021 11:21:52 AM  Radiology DG Chest 2 View  Result Date: 05/19/2021 CLINICAL DATA:  Burning chest pain. EXAM: CHEST - 2 VIEW COMPARISON:  Chest x-ray dated June 13, 2017. FINDINGS: The heart size and mediastinal contours are within normal limits. Normal pulmonary vascularity. The lungs are hyperinflated. No focal consolidation, pleural effusion, or pneumothorax. No acute osseous abnormality. IMPRESSION: 1. No acute cardiopulmonary disease. Electronically Signed   By: Titus Dubin M.D.   On: 05/19/2021 10:16    Procedures Procedures    Medications Ordered in ED Medications - No data to display  ED Course/ Medical Decision Making/ A&P     76 year old male with history of atrial fibrillation, renal cell carcinoma, COPD, hypertension, history of bradycardia and atrial tachycardia seen by Dr. Clydie Braun who presents with concern for chest pain.  Differential diagnosis for chest pain includes pulmonary embolus, dissection, pneumothorax, pneumonia, ACS, myocarditis, pericarditis.  EKG was done and evaluate by me and showed no acute ST changes and no signs of pericarditis, similar peaked TW as previous ECG. Chest x-ray was done and evaluated by  me and radiology and showed no sign of pneumonia or pneumothorax.  He has no dyspnea, no hypoxia, low suspicion for PE.  Do not feel history or exam are consistent with aortic dissection.  Has normal pulses, no sign of acute arterial thrombus.  Has had left hand numbness, over months, is in distribution of median nerve, worse when waking, consider carpal tunnel as etiology of this. Does not have acute numbness or weakness to suggest acute CVA.  Pain is worse with movements, turning, suspect possible nerve or msk abnormality.  No exertional pain.  Consider possible spinal or other thoracic compressive etiology of numbness in hand and pain worse with turning, given cancer hx feel outpatient MRI is appropriate. Given splint for nighttime wear for possible carpal tunnel. Recommend close PCP follow up. Patient discharged in stable condition with understanding of reasons to return.          Final Clinical Impression(s) / ED Diagnoses Final diagnoses:  Chest pain, unspecified type    Rx / DC Orders ED Discharge Orders     None         Gareth Morgan, MD 05/19/21 2246

## 2022-03-20 IMAGING — CR DG CHEST 2V
2 series · 2 of 2 positions shown · non-contrast
Comparison: Chest x-ray dated June 13, 2017.

CLINICAL DATA: Burning chest pain.

EXAM:
CHEST - 2 VIEW

[w chest pa]
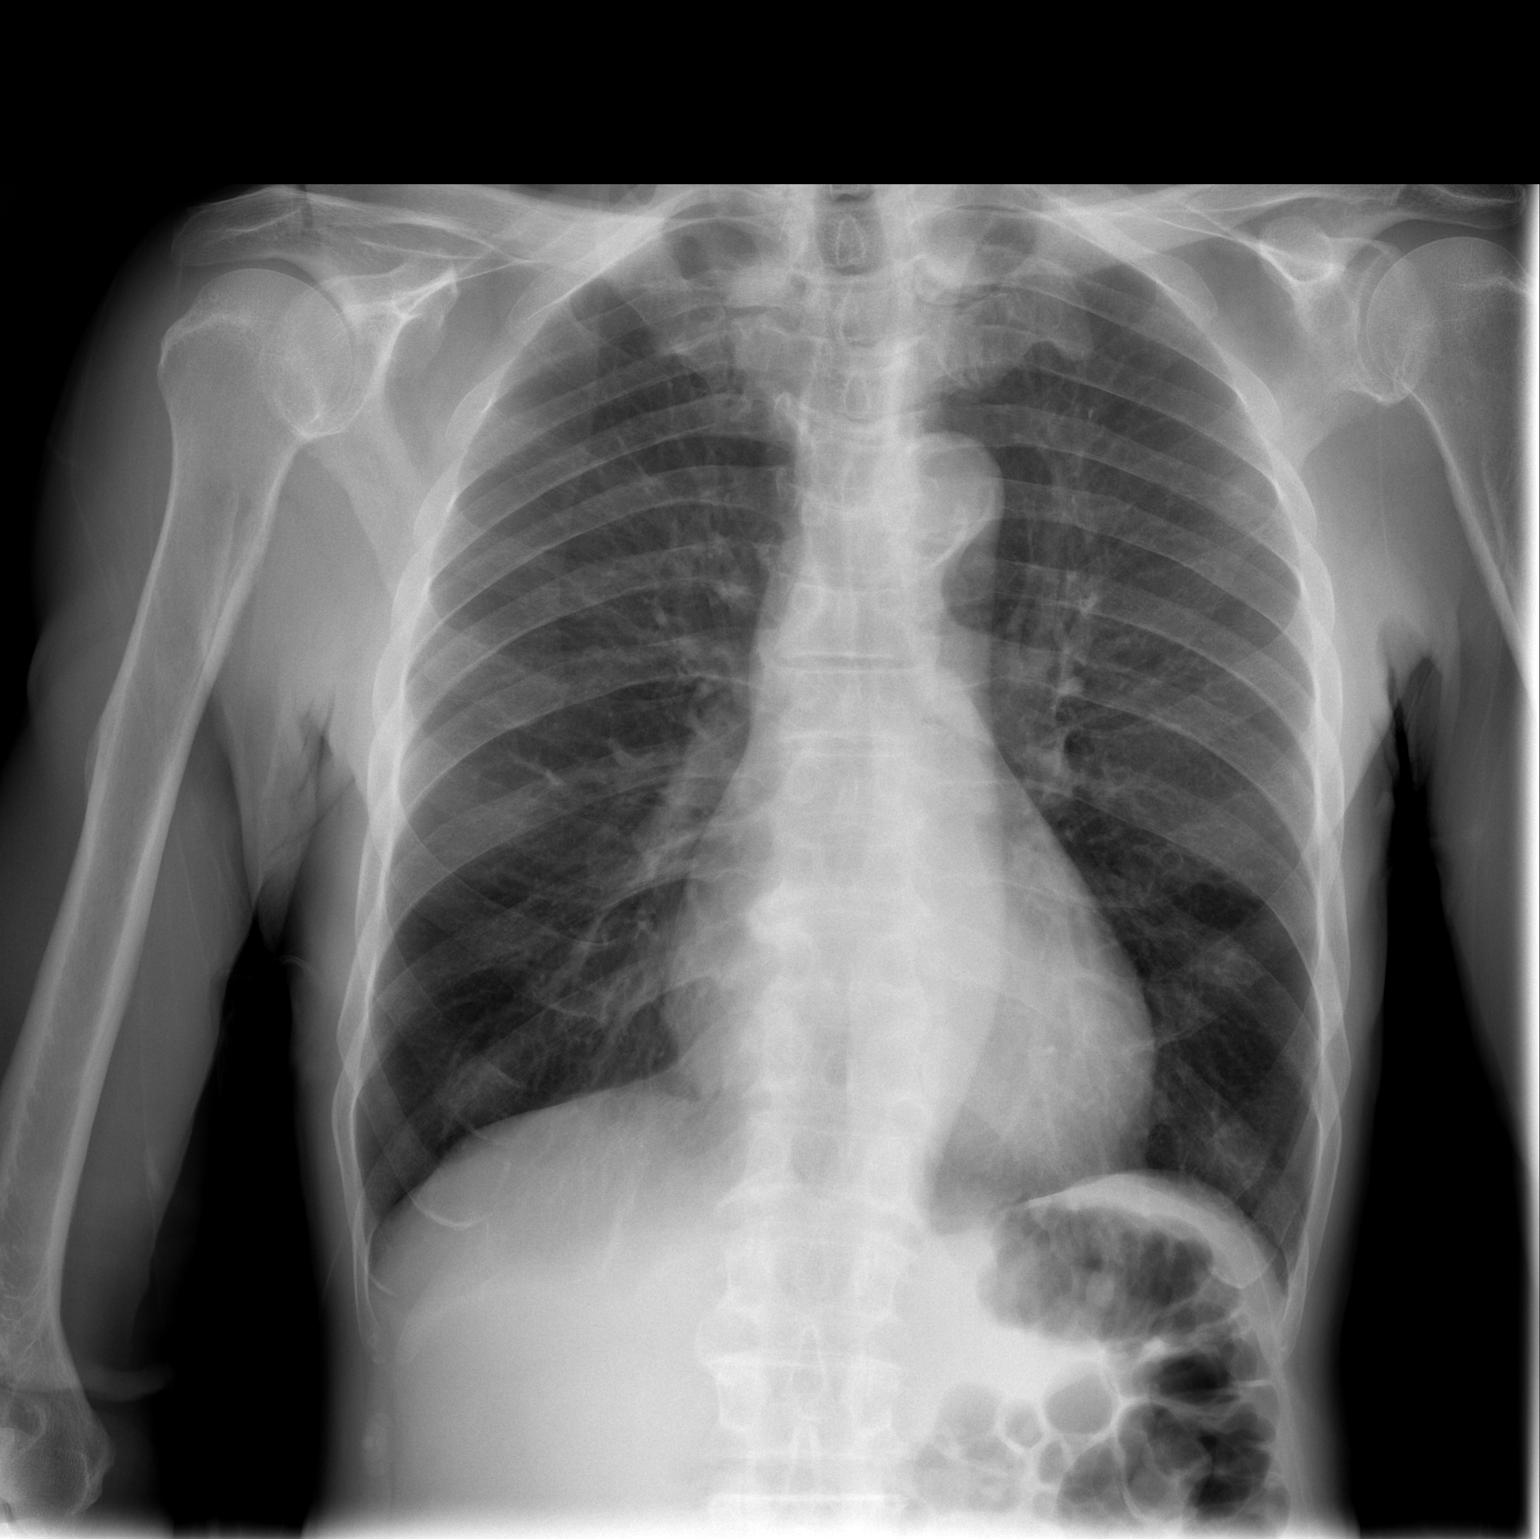

[w chest lat]
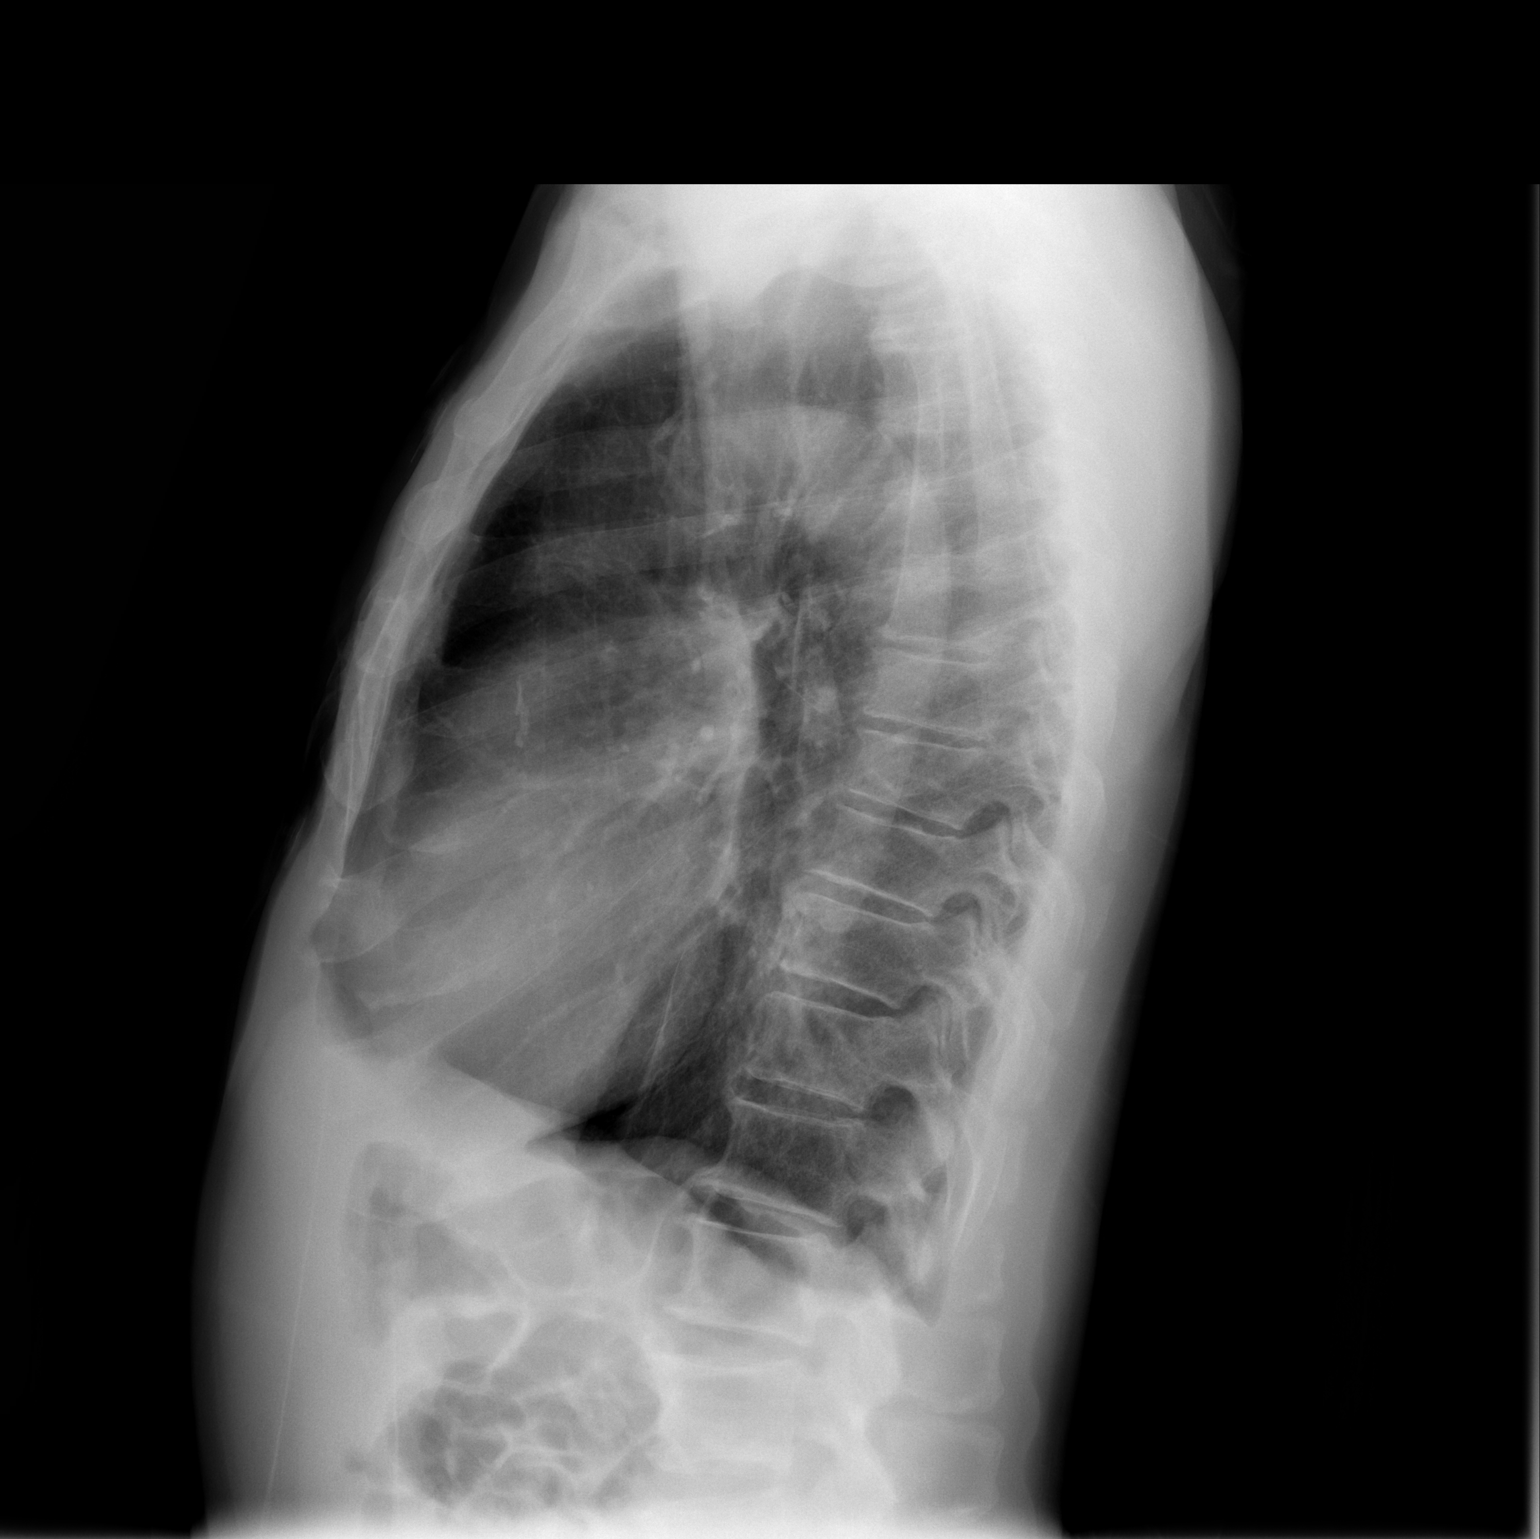

[2 of 2 positions shown; findings below may reference images not displayed]

FINDINGS: The heart size and mediastinal contours are within normal limits.
Normal pulmonary vascularity. The lungs are hyperinflated. No focal
consolidation, pleural effusion, or pneumothorax. No acute osseous
abnormality.
IMPRESSION: 1. No acute cardiopulmonary disease.

## 2022-08-07 ENCOUNTER — Emergency Department (HOSPITAL_BASED_OUTPATIENT_CLINIC_OR_DEPARTMENT_OTHER): Payer: Medicare PPO

## 2022-08-07 ENCOUNTER — Other Ambulatory Visit: Payer: Self-pay

## 2022-08-07 ENCOUNTER — Encounter (HOSPITAL_BASED_OUTPATIENT_CLINIC_OR_DEPARTMENT_OTHER): Payer: Self-pay | Admitting: Emergency Medicine

## 2022-08-07 ENCOUNTER — Observation Stay (HOSPITAL_BASED_OUTPATIENT_CLINIC_OR_DEPARTMENT_OTHER)
Admission: EM | Admit: 2022-08-07 | Discharge: 2022-08-09 | Disposition: A | Discharge status: Home / Self Care | Payer: Medicare PPO | Attending: Internal Medicine | Admitting: Internal Medicine

## 2022-08-07 DIAGNOSIS — R55 Syncope and collapse: Secondary | ICD-10-CM | POA: Diagnosis present

## 2022-08-07 DIAGNOSIS — Z79899 Other long term (current) drug therapy: Secondary | ICD-10-CM | POA: Diagnosis not present

## 2022-08-07 DIAGNOSIS — N3289 Other specified disorders of bladder: Secondary | ICD-10-CM | POA: Diagnosis not present

## 2022-08-07 DIAGNOSIS — N329 Bladder disorder, unspecified: Secondary | ICD-10-CM | POA: Diagnosis not present

## 2022-08-07 DIAGNOSIS — Z85528 Personal history of other malignant neoplasm of kidney: Secondary | ICD-10-CM | POA: Insufficient documentation

## 2022-08-07 DIAGNOSIS — I10 Essential (primary) hypertension: Secondary | ICD-10-CM | POA: Diagnosis not present

## 2022-08-07 DIAGNOSIS — N179 Acute kidney failure, unspecified: Secondary | ICD-10-CM | POA: Diagnosis not present

## 2022-08-07 DIAGNOSIS — F17210 Nicotine dependence, cigarettes, uncomplicated: Secondary | ICD-10-CM | POA: Diagnosis not present

## 2022-08-07 DIAGNOSIS — I4891 Unspecified atrial fibrillation: Secondary | ICD-10-CM | POA: Diagnosis not present

## 2022-08-07 LAB — CBG MONITORING, ED: Glucose-Capillary: 102 mg/dL — ABNORMAL HIGH (ref 70–99)

## 2022-08-07 LAB — CBC
HCT: 37.3 % — ABNORMAL LOW (ref 39.0–52.0)
Hemoglobin: 12.4 g/dL — ABNORMAL LOW (ref 13.0–17.0)
MCH: 30.8 pg (ref 26.0–34.0)
MCHC: 33.2 g/dL (ref 30.0–36.0)
MCV: 92.8 fL (ref 80.0–100.0)
Platelets: 215 10*3/uL (ref 150–400)
RBC: 4.02 MIL/uL — ABNORMAL LOW (ref 4.22–5.81)
RDW: 13.6 % (ref 11.5–15.5)
WBC: 10.4 10*3/uL (ref 4.0–10.5)
nRBC: 0 % (ref 0.0–0.2)

## 2022-08-07 LAB — BASIC METABOLIC PANEL
Anion gap: 10 (ref 5–15)
BUN: 47 mg/dL — ABNORMAL HIGH (ref 8–23)
CO2: 24 mmol/L (ref 22–32)
Calcium: 8.8 mg/dL — ABNORMAL LOW (ref 8.9–10.3)
Chloride: 100 mmol/L (ref 98–111)
Creatinine, Ser: 1.63 mg/dL — ABNORMAL HIGH (ref 0.61–1.24)
GFR, Estimated: 44 mL/min — ABNORMAL LOW (ref >60)
Glucose, Bld: 105 mg/dL — ABNORMAL HIGH (ref 70–99)
Potassium: 4.1 mmol/L (ref 3.5–5.1)
Sodium: 134 mmol/L — ABNORMAL LOW (ref 135–145)

## 2022-08-07 LAB — URINALYSIS, ROUTINE W REFLEX MICROSCOPIC
Bilirubin Urine: NEGATIVE
Glucose, UA: NEGATIVE mg/dL
Hgb urine dipstick: NEGATIVE
Ketones, ur: NEGATIVE mg/dL
Leukocytes,Ua: NEGATIVE
Nitrite: NEGATIVE
Protein, ur: NEGATIVE mg/dL
Specific Gravity, Urine: 1.025 (ref 1.005–1.030)
pH: 5.5 (ref 5.0–8.0)

## 2022-08-07 LAB — TROPONIN I (HIGH SENSITIVITY)
Troponin I (High Sensitivity): 7 ng/L (ref <18)
Troponin I (High Sensitivity): 7 ng/L (ref <18)

## 2022-08-07 MED ORDER — SODIUM CHLORIDE 0.9 % IV BOLUS
500.0000 mL | Freq: Once | INTRAVENOUS | Status: AC
  Administered 2022-08-07: 500 mL via INTRAVENOUS

## 2022-08-07 NOTE — ED Provider Notes (Signed)
Emergency Department Provider Note   I have reviewed the triage vital signs and the nursing notes.   HISTORY  Chief Complaint Near Syncope   HPI Tyler Hunt is a 77 y.o. male with past medical history reviewed below presents to the emergency department with fatigue, hypotension, and near syncope.  Patient was working in his house today and doing some mild rearranging in his closet which is not particularly strenuous.  He been eating and drinking without difficulty although appetite have been somewhat lower.  He felt suddenly very lightheaded like he may pass out and states he nearly did pass out.  No chest pain or shortness of breath.  No fevers.  He took his blood pressure and found it to be in the 80s at home and so presented to the emergency department. No prior history of AKI.    Past Medical History:  Diagnosis Date   Acid reflux    Atrial fibrillation    01-2013 - identified on holter monitor, RVR with abberration   Benign hypertension    Carotid bruit    Carotid bruit    Chest pain 02/2013   CP in left anterior and left lateral chest. Described as pressure-like & sharp. Pain is moderate & worsening. Sx exacerbated by lying down and spicy foods. Has associated heartburn, nausea & weakness.   DOE (dyspnea on exertion)    Enlarged prostate    GI bleed 2014   Palpitations    SOB (shortness of breath)    SVT (supraventricular tachycardia)    Sx include palpitations, chest pain and near syncope. Pain located in substernal area.   Syncope and collapse 02/2013   felt to be vasovagal.    Tobacco abuse    Ventricular tachycardia    ECHO 02/15/13 - EF between 55-60%    Review of Systems  Constitutional: No fever/chills. Positive weakness.  Cardiovascular: Denies chest pain. Positive near syncope.  Respiratory: Denies shortness of breath. Gastrointestinal: No abdominal pain.  No nausea, no vomiting.  Musculoskeletal: Negative for back pain. Skin: Negative for  rash. Neurological: Negative for headaches.  ____________________________________________   PHYSICAL EXAM:  VITAL SIGNS: ED Triage Vitals  Enc Vitals Group     BP 08/07/22 1655 99/60     Pulse Rate 08/07/22 1655 71     Resp 08/07/22 1655 20     Temp 08/07/22 1655 98.8 F (37.1 C)     Temp Source 08/07/22 1655 Oral     SpO2 08/07/22 1655 98 %     Weight 08/07/22 1657 115 lb (52.2 kg)   Constitutional: Alert and oriented. Well appearing and in no acute distress. Eyes: Conjunctivae are normal.  Head: Atraumatic. Nose: No congestion/rhinnorhea. Mouth/Throat: Mucous membranes are moist.  Neck: No stridor.   Cardiovascular: Normal rate, regular rhythm. Good peripheral circulation. Grossly normal heart sounds.   Respiratory: Normal respiratory effort.  No retractions. Lungs CTAB. Gastrointestinal: Soft and nontender. No distention.  Musculoskeletal: No lower extremity tenderness nor edema. No gross deformities of extremities. Neurologic:  Normal speech and language. No gross focal neurologic deficits are appreciated.  Skin:  Skin is warm, dry and intact. No rash noted. ____________________________________________   LABS (all labs ordered are listed, but only abnormal results are displayed)  Labs Reviewed  BASIC METABOLIC PANEL - Abnormal; Notable for the following components:      Result Value   Sodium 134 (*)    Glucose, Bld 105 (*)    BUN 47 (*)    Creatinine, Ser  1.63 (*)    Calcium 8.8 (*)    GFR, Estimated 44 (*)    All other components within normal limits  CBC - Abnormal; Notable for the following components:   RBC 4.02 (*)    Hemoglobin 12.4 (*)    HCT 37.3 (*)    All other components within normal limits  CBG MONITORING, ED - Abnormal; Notable for the following components:   Glucose-Capillary 102 (*)    All other components within normal limits  URINALYSIS, ROUTINE W REFLEX MICROSCOPIC  TROPONIN I (HIGH SENSITIVITY)  TROPONIN I (HIGH SENSITIVITY)    ____________________________________________  EKG   EKG Interpretation  Date/Time:  Friday August 07 2022 16:58:30 EDT Ventricular Rate:  66 PR Interval:  104 QRS Duration: 82 QT Interval:  391 QTC Calculation: 410 R Axis:   79 Text Interpretation: Sinus rhythm Short PR interval Right atrial enlargement ST changes similar to prior Tall T, Similar to January 2023 tracing Confirmed by Alona Bene [phone]) on 08/07/2022 5:00:49 PM        ____________________________________________  RADIOLOGY  CT ABDOMEN PELVIS WO CONTRAST  Result Date: 08/07/2022 CLINICAL DATA:  Metastatic disease evaluation.  Abdominal pain. EXAM: CT ABDOMEN AND PELVIS WITHOUT CONTRAST TECHNIQUE: Multidetector CT imaging of the abdomen and pelvis was performed following the standard protocol without IV contrast. RADIATION DOSE REDUCTION: This exam was performed according to the departmental dose-optimization program which includes automated exposure control, adjustment of the mA and/or kV according to patient size and/or use of iterative reconstruction technique. COMPARISON:  05/01/2022. FINDINGS: Lower chest: Linear scarring in the lung bases. Hepatobiliary: No focal hepatic abnormality. Gallbladder unremarkable. Pancreas: No focal abnormality or ductal dilatation. Spleen: No focal abnormality.  Normal size. Adrenals/Urinary Tract: Cryoablation changes noted in the upper pole of the right kidney, unchanged since prior study. Cysts in the mid and lower pole of the right kidney are unchanged in size since prior study. No hydronephrosis. Adrenal glands and urinary bladder unremarkable. Stomach/Bowel: Stomach, large and small bowel grossly unremarkable. Vascular/Lymphatic: Aortic atherosclerosis. No evidence of aneurysm or adenopathy. Reproductive: Prostate enlargement. Other: No free fluid or free air. Musculoskeletal: No acute bony abnormality. IMPRESSION: Post cryoablation changes noted in the upper pole of the right  kidney, unchanged as best determined on this un enhanced study. Stable cysts in the mid and lower pole of the right kidney. Linear scarring in the lung bases. Aortic atherosclerosis. No acute findings. Electronically Signed   By: Charlett Nose M.D.   On: 08/07/2022 22:13   US Renal  Result Date: 08/07/2022 CLINICAL DATA:  Acute kidney injury. EXAM: RENAL / URINARY TRACT ULTRASOUND COMPLETE COMPARISON:  None Available. FINDINGS: Right Kidney: Renal measurements: 9.8 cm x 4.5 cm x 4.8 cm = volume: 111.1 mL. Echogenicity within normal limits. 1.8 cm x 1.8 cm x 2.0 cm and 3.4 cm x 2.9 cm x 3.2 cm simple cysts are seen within the upper pole and lower pole of the right kidney. No hydronephrosis is visualized. Left Kidney: Renal measurements: 10.4 cm x 4.9 cm x 5.0 cm = volume: 132.5 mL. Echogenicity within normal limits. No mass or hydronephrosis visualized. Bladder: A 1.6 cm x 0.6 cm x 0.6 cm hyperechoic area is seen along the nondependent wall of the urinary bladder. No flow is seen within this region on color Doppler evaluation. Other: The prostate gland is enlarged and measures 3.6 cm x 4.4 cm x 3.8 cm. IMPRESSION: 1. Findings concerning for the presence of a urinary bladder mass. Urology consult and  MRI correlation are recommended, as an underlying neoplastic process cannot be excluded. 2. Large simple right renal cysts. 3. Prostatomegaly. Correlation with the patient's PSA levels is recommended. Electronically Signed   By: Aram Candela M.D.   On: 08/07/2022 19:43    ____________________________________________   PROCEDURES  Procedure(s) performed:   Procedures  None  ____________________________________________   INITIAL IMPRESSION / ASSESSMENT AND PLAN / ED COURSE  Pertinent labs & imaging results that were available during my care of the patient were reviewed by me and considered in my medical decision making (see chart for details).   This patient is Presenting for Evaluation of  weakness/near syncope, which does require a range of treatment options, and is a complaint that involves a high risk of morbidity and mortality.  The Differential Diagnoses include cardiogenic syncope, AKI, dehydration, post-renal obstruction, sepsis, etc.  Critical Interventions-    Medications  sodium chloride 0.9 % bolus 500 mL ( Intravenous Stopped 08/07/22 1935)    Reassessment after intervention: Symptoms improved.   I decided to review pertinent External Data, and in summary no abnormal creatinine in the past in Care Everywhere.    Clinical Laboratory Tests Ordered, included BUN and creatinine elevated with creatinine 1.63 baseline 1.8.  Troponin normal.  No urinary tract infection.  Mild anemia 12.4. No acidosis.   Radiologic Tests Ordered, included CT renal and renal US. I independently interpreted the images and agree with radiology interpretation.   Cardiac Monitor Tracing which shows NSR.    Social Determinants of Health Risk patient is a smoker.   Consult complete with Dr. Margo Aye. Plan for admit for AKI.   Medical Decision Making: Summary:  Patient presents emergency department for evaluation of near syncope and hypotension at home.  Found here to have acute kidney injury.  Likely prerenal.  He does have some bladder irregularity on ultrasound but clinically he is urinating here normally.  Do not suspect an obstructive uropathy.  Discussed the bladder findings and need for close urology follow-up but does not appear to require emergent consultation.   Reevaluation with update and discussion with patient. Plan for admit for AKI/IVF and syncope workup. Patient in agreement.   Patient's presentation is most consistent with acute presentation with potential threat to life or bodily function.   Disposition: admit  ____________________________________________  FINAL CLINICAL IMPRESSION(S) / ED DIAGNOSES  Final diagnoses:  Near syncope  AKI (acute kidney injury)  Mass of  bladder    Note:  This document was prepared using Dragon voice recognition software and may include unintentional dictation errors.  Alona Bene, MD, [facility]    Harout Scheurich, Arlyss Repress, MD 08/07/22 [phone]

## 2022-08-07 NOTE — ED Notes (Signed)
ED triage Note charted by this Nurse , Cecilie Lowers, EMT did not triage  pt or type the note

## 2022-08-07 NOTE — ED Triage Notes (Signed)
Near syncope today , dizziness , hypotension reading at home , Hx HTN .  Low BP all week he said .

## 2022-08-07 NOTE — Progress Notes (Signed)
Plan of Care Note for accepted transfer   Patient: Tyler Hunt MRN: [id number]   DOA: 08/07/2022  Facility requesting transfer: [facility] ED Requesting Provider: Dr. Jacqulyn Bath, EDP Reason for transfer: Near syncope, AKI, bladder mass without left or right hydronephrosis.  Facility course: The patient is a 77 year old male with past medical history significant for essential hypertension, BPH, GERD, history of right renal mass, GI bleed, who presented to [facility] ED after a near syncopal episode at home today.  The patient was reorganizing his closet and almost passed out.  He checked his blood pressure and SBP was in the 80s.  He presented to the ED for further evaluation.  In the ED, his blood pressure was improved.  His lab studies were notable for elevated creatinine 1.63 from a normal baseline.  He had a renal ultrasound which showed the following: 1. Findings concerning for the presence of a urinary bladder mass. Urology consult and MRI correlation are recommended, as an underlying neoplastic process cannot be excluded. 2. Large simple right renal cysts. 3. Prostatomegaly. Correlation with the patient's PSA levels is recommended.  The patient has voided in the ED, per EDP, can be evaluated by urology outpatient.  Not in acute distress at this time.  The patient received 500 cc NS IV fluid bolus.  Admitted at [facility] long hospital telemetry unit as observation status.   Plan of care: The patient is accepted for admission to Telemetry unit, at [facility].  Author: Darlin Drop, DO 08/07/2022  Check [email or web address] for on-call coverage.  Nursing staff, Please call [facility] Admits & Consults System-Wide number on Amion as soon as patient's arrival, so appropriate admitting provider can evaluate the pt.

## 2022-08-08 ENCOUNTER — Observation Stay (HOSPITAL_BASED_OUTPATIENT_CLINIC_OR_DEPARTMENT_OTHER): Payer: Medicare PPO

## 2022-08-08 ENCOUNTER — Encounter (HOSPITAL_COMMUNITY): Payer: Self-pay | Admitting: Family Medicine

## 2022-08-08 DIAGNOSIS — I10 Essential (primary) hypertension: Secondary | ICD-10-CM

## 2022-08-08 DIAGNOSIS — R55 Syncope and collapse: Secondary | ICD-10-CM

## 2022-08-08 DIAGNOSIS — N179 Acute kidney failure, unspecified: Secondary | ICD-10-CM | POA: Diagnosis present

## 2022-08-08 DIAGNOSIS — N3289 Other specified disorders of bladder: Secondary | ICD-10-CM | POA: Diagnosis present

## 2022-08-08 LAB — BASIC METABOLIC PANEL
Anion gap: 8 (ref 5–15)
BUN: 31 mg/dL — ABNORMAL HIGH (ref 8–23)
CO2: 24 mmol/L (ref 22–32)
Calcium: 8.4 mg/dL — ABNORMAL LOW (ref 8.9–10.3)
Chloride: 106 mmol/L (ref 98–111)
Creatinine, Ser: 1 mg/dL (ref 0.61–1.24)
GFR, Estimated: >60 mL/min (ref >60)
Glucose, Bld: 112 mg/dL — ABNORMAL HIGH (ref 70–99)
Potassium: 4 mmol/L (ref 3.5–5.1)
Sodium: 138 mmol/L (ref 135–145)

## 2022-08-08 LAB — ECHOCARDIOGRAM COMPLETE
Area-P 1/2: 2.34 cm2
Height: 67 in
MV VTI: 2.21 cm2
S' Lateral: 2.8 cm
Weight: 1848 oz

## 2022-08-08 LAB — CBC
HCT: 35.4 % — ABNORMAL LOW (ref 39.0–52.0)
Hemoglobin: 11.7 g/dL — ABNORMAL LOW (ref 13.0–17.0)
MCH: 31 pg (ref 26.0–34.0)
MCHC: 33.1 g/dL (ref 30.0–36.0)
MCV: 93.7 fL (ref 80.0–100.0)
Platelets: 204 10*3/uL (ref 150–400)
RBC: 3.78 MIL/uL — ABNORMAL LOW (ref 4.22–5.81)
RDW: 13.5 % (ref 11.5–15.5)
WBC: 7.7 10*3/uL (ref 4.0–10.5)
nRBC: 0 % (ref 0.0–0.2)

## 2022-08-08 LAB — GLUCOSE, CAPILLARY: Glucose-Capillary: 119 mg/dL — ABNORMAL HIGH (ref 70–99)

## 2022-08-08 LAB — CREATININE, URINE, RANDOM: Creatinine, Urine: 116 mg/dL

## 2022-08-08 LAB — MAGNESIUM: Magnesium: 2.3 mg/dL (ref 1.7–2.4)

## 2022-08-08 LAB — SODIUM, URINE, RANDOM: Sodium, Ur: 39 mmol/L

## 2022-08-08 MED ORDER — SODIUM CHLORIDE 0.9% FLUSH
3.0000 mL | Freq: Two times a day (BID) | INTRAVENOUS | Status: DC
  Administered 2022-08-08 – 2022-08-09 (×2): 3 mL via INTRAVENOUS

## 2022-08-08 MED ORDER — PANTOPRAZOLE SODIUM 40 MG PO TBEC
40.0000 mg | DELAYED_RELEASE_TABLET | Freq: Every day | ORAL | Status: DC
  Administered 2022-08-08 – 2022-08-09 (×2): 40 mg via ORAL
  Filled 2022-08-08 (×2): qty 1

## 2022-08-08 MED ORDER — OXYCODONE HCL 5 MG PO TABS: 5.0000 mg | ORAL_TABLET | ORAL | Status: DC | PRN

## 2022-08-08 MED ORDER — SENNA 8.6 MG PO TABS: 1.0000 | ORAL_TABLET | Freq: Every day | ORAL | Status: DC | PRN

## 2022-08-08 MED ORDER — ATORVASTATIN CALCIUM 10 MG PO TABS
10.0000 mg | ORAL_TABLET | Freq: Every day | ORAL | Status: DC
  Administered 2022-08-08 – 2022-08-09 (×2): 10 mg via ORAL
  Filled 2022-08-08 (×3): qty 1

## 2022-08-08 MED ORDER — TAMSULOSIN HCL 0.4 MG PO CAPS
0.4000 mg | ORAL_CAPSULE | Freq: Every day | ORAL | Status: DC
  Administered 2022-08-08 – 2022-08-09 (×2): 0.4 mg via ORAL
  Filled 2022-08-08 (×2): qty 1

## 2022-08-08 MED ORDER — ENOXAPARIN SODIUM 40 MG/0.4ML IJ SOSY: 40.0000 mg | PREFILLED_SYRINGE | INTRAMUSCULAR | Status: DC

## 2022-08-08 MED ORDER — IPRATROPIUM-ALBUTEROL 0.5-2.5 (3) MG/3ML IN SOLN: 3.0000 mL | RESPIRATORY_TRACT | Status: DC | PRN

## 2022-08-08 MED ORDER — SODIUM CHLORIDE 0.9 % IV SOLN: INTRAVENOUS | Status: AC

## 2022-08-08 MED ORDER — ACETAMINOPHEN 325 MG PO TABS: 650.0000 mg | ORAL_TABLET | Freq: Four times a day (QID) | ORAL | Status: DC | PRN

## 2022-08-08 MED ORDER — MELATONIN 3 MG PO TABS
3.0000 mg | ORAL_TABLET | Freq: Once | ORAL | Status: AC
  Administered 2022-08-08: 3 mg via ORAL
  Filled 2022-08-08: qty 1

## 2022-08-08 MED ORDER — ENOXAPARIN SODIUM 30 MG/0.3ML IJ SOSY
30.0000 mg | PREFILLED_SYRINGE | INTRAMUSCULAR | Status: DC
  Filled 2022-08-08: qty 0.3

## 2022-08-08 MED ORDER — IPRATROPIUM-ALBUTEROL 18-103 MCG/ACT IN AERO: 1.0000 | INHALATION_SPRAY | RESPIRATORY_TRACT | Status: DC | PRN

## 2022-08-08 MED ORDER — ACETAMINOPHEN 650 MG RE SUPP: 650.0000 mg | Freq: Four times a day (QID) | RECTAL | Status: DC | PRN

## 2022-08-08 NOTE — Progress Notes (Signed)
  Echocardiogram 2D Echocardiogram has been performed.  Tyler Hunt 08/08/2022, 3:31 PM

## 2022-08-08 NOTE — H&P (Signed)
History and Physical    Tyler Hunt ZOX:[id number] DOB: 22-Apr-1945 DOA: 08/07/2022  PCP: Judd Lien, PA-C   Patient coming from: Home   Chief Complaint: Almost passed out, BP low   HPI: Tyler Hunt is a pleasant 77 y.o. 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  Patient is treated with Norvasc and losartan for hypertension but has noted his blood pressure to be low for the past several days.  Reports a SBP readings as low as 78 and others in the 80s and 90s.  He felt well this morning when he was doing some light housework and suddenly felt as though he is about to pass out.  There was no chest pain or palpitations associated with this.  He took his blood pressure, noted SBP to be in the 80s, and came in for evaluation.  He reports poor appetite and weight loss, but this has been going on for years and he denies any recent vomiting, diarrhea, or change in his oral intake.  [facility] ED Course: Upon arrival to the ED, patient is found to be afebrile and saturating well on room air stable blood pressure.  EKG demonstrates sinus rhythm.  Renal ultrasound concerning for a bladder mass but no hydronephrosis.  No acute findings noted on CT of the abdomen and pelvis.  Labs are notable for normal troponin x 2, BUN 47, and creatinine 1.63.  Patient was given 500 mL of normal saline and transferred to [facility] for admission.  Review of Systems:  All other systems reviewed and apart from HPI, are negative.  Past Medical History:  Diagnosis Date   Acid reflux    Atrial fibrillation    01-2013 - identified on holter monitor, RVR with abberration   Benign hypertension    Carotid bruit    Carotid bruit    Chest pain 02/2013   CP in left anterior and left lateral chest. Described as  pressure-like & sharp. Pain is moderate & worsening. Sx exacerbated by lying down and spicy foods. Has associated heartburn, nausea & weakness.   DOE (dyspnea on exertion)    Enlarged prostate    GI bleed 2014   Palpitations    SOB (shortness of breath)    SVT (supraventricular tachycardia)    Sx include palpitations, chest pain and near syncope. Pain located in substernal area.   Syncope and collapse 02/2013   felt to be vasovagal.    Tobacco abuse    Ventricular tachycardia    ECHO 02/15/13 - EF between 55-60%    Past Surgical History:  Procedure Laterality Date   COLONOSCOPY N/A 02/23/2014   Procedure: COLONOSCOPY;  Surgeon: Beverley Fiedler, MD;  Location[facility] ENDOSCOPY;  Service: Endoscopy;  Laterality: N/A;  ultraslim scope   ENTEROSCOPY N/A 02/23/2014   Procedure: ENTEROSCOPY;  Surgeon: Beverley Fiedler, MD;  Location: [facility] ENDOSCOPY;  Service: Endoscopy;  Laterality: N/A;   LEFT HEART CATHETERIZATION WITH CORONARY ANGIOGRAM N/A 02/21/2013   Procedure: LEFT HEART CATHETERIZATION WITH CORONARY ANGIOGRAM;  Surgeon: [name], MD;  Location: [facility] CATH LAB;  Service: Cardiovascular;  Laterality: N/A;    Social History:   reports that he has been smoking cigarettes. He has a 20.00 pack-year smoking history. He has never used smokeless tobacco. He reports current alcohol use. He reports current drug use. Drug:  Marijuana.  No Known Allergies  Family History  Problem Relation Age of Onset   Hyperlipidemia Mother    Hypertension Mother    Hyperlipidemia Father    Hypertension Father    Hyperlipidemia Brother    Hypertension Sister    Hypertension Brother      Prior to Admission medications   Medication Sig Start Date End Date Taking? Authorizing Provider  albuterol (PROVENTIL HFA;VENTOLIN HFA) 108 (90 Base) MCG/ACT inhaler Inhale 2 puffs into the lungs every 4 (four) hours as needed for wheezing or shortness of breath. 06/14/17   Horton, Mayer Masker, MD  albuterol-ipratropium  (COMBIVENT) 18-103 MCG/ACT inhaler Inhale 1 puff into the lungs every 4 (four) hours.    [provider]  amLODipine (NORVASC) 5 MG tablet Take 5 mg by mouth daily. 03/21/21   [provider]  atorvastatin (LIPITOR) 10 MG tablet Take 10 mg by mouth daily. 03/14/21   [provider]  cyclobenzaprine (FLEXERIL) 10 MG tablet Take 10 mg by mouth at bedtime. 01/06/21   [provider]  ferrous sulfate 325 (65 FE) MG tablet Take 325 mg by mouth 2 (two) times daily with a meal.     [provider]  finasteride (PROSCAR) 5 MG tablet Take 5 mg by mouth daily as needed.     [provider]  gabapentin (NEURONTIN) 300 MG capsule Take 300 mg by mouth 3 (three) times daily. 01/22/21   [provider]  losartan (COZAAR) 25 MG tablet Take 25 mg by mouth daily. 05/10/21   [provider]  meloxicam (MOBIC) 15 MG tablet Take 15 mg by mouth daily. 01/06/21   [provider]  Multiple Vitamin (MULTIVITAMIN WITH MINERALS) TABS tablet Take 1 tablet by mouth daily.    [provider]  omeprazole (PRILOSEC) 20 MG capsule Take 20 mg by mouth daily. 04/23/21   [provider]  tamsulosin (FLOMAX) 0.4 MG CAPS capsule Take 0.4 mg by mouth daily.    [provider]    Physical Exam: Vitals:   08/08/22 0100 08/08/22 0130 08/08/22 0201 08/08/22 0233  BP: 105/67 102/66  115/79  Pulse: (!) 50 (!) 50  (!) 54  Resp: 12 14  16   Temp:   98.2 F (36.8 C) 98.4 F (36.9 C)  TempSrc:    Oral  SpO2: 98% 98%  96%  Weight:    52.4 kg  Height:    5\' 7"  (1.702 m)    Constitutional: NAD, calm  Eyes: PERTLA, lids and conjunctivae normal ENMT: Mucous membranes are moist. Posterior pharynx clear of any exudate or lesions.   Neck: supple, no masses  Respiratory: no wheezing, no crackles. No accessory muscle use.  Cardiovascular: S1 & S2 heard, regular rate and rhythm. No extremity edema.   Abdomen: No distension, no tenderness,  soft. Bowel sounds active.  Musculoskeletal: no clubbing / cyanosis. No joint deformity upper and lower extremities.   Skin: no significant rashes, lesions, ulcers. Warm, dry, well-perfused. Neurologic: CN 2-12 grossly intact. Moving all extremities. Alert and oriented.  Psychiatric: Pleasant. Cooperative.    Labs and Imaging on Admission: I have personally reviewed following labs and imaging studies  CBC: Recent Labs  Lab 08/07/22 1659  WBC 10.4  HGB 12.4*  HCT 37.3*  MCV 92.8  PLT 215   Basic Metabolic Panel: Recent Labs  Lab 08/07/22 1659  NA 134*  K 4.1  CL 100  CO2 24  GLUCOSE 105*  BUN 47*  CREATININE 1.63*  CALCIUM  8.8*   GFR: Estimated Creatinine Clearance: 29.5 mL/min (A) (by C-G formula based on SCr of 1.63 mg/dL (H)). Liver Function Tests: No results for input(s): "AST", "ALT", "ALKPHOS", "BILITOT", "PROT", "ALBUMIN" in the last 168 hours. No results for input(s): "LIPASE", "AMYLASE" in the last 168 hours. No results for input(s): "AMMONIA" in the last 168 hours. Coagulation Profile: No results for input(s): "INR", "PROTIME" in the last 168 hours. Cardiac Enzymes: No results for input(s): "CKTOTAL", "CKMB", "CKMBINDEX", "TROPONINI" in the last 168 hours. BNP (last 3 results) No results for input(s): "PROBNP" in the last 8760 hours. HbA1C: No results for input(s): "HGBA1C" in the last 72 hours. CBG: Recent Labs  Lab 08/07/22 1702  GLUCAP 102*   Lipid Profile: No results for input(s): "CHOL", "HDL", "LDLCALC", "TRIG", "CHOLHDL", "LDLDIRECT" in the last 72 hours. Thyroid Function Tests: No results for input(s): "TSH", "T4TOTAL", "FREET4", "T3FREE", "THYROIDAB" in the last 72 hours. Anemia Panel: No results for input(s): "VITAMINB12", "FOLATE", "FERRITIN", "TIBC", "IRON", "RETICCTPCT" in the last 72 hours. Urine analysis:    Component Value Date/Time   COLORURINE YELLOW 08/07/2022 2010   APPEARANCEUR CLEAR 08/07/2022 2010   LABSPEC 1.025  08/07/2022 2010   PHURINE 5.5 08/07/2022 2010   GLUCOSEU NEGATIVE 08/07/2022 2010   HGBUR NEGATIVE 08/07/2022 2010   BILIRUBINUR NEGATIVE 08/07/2022 2010   KETONESUR NEGATIVE 08/07/2022 2010   PROTEINUR NEGATIVE 08/07/2022 2010   NITRITE NEGATIVE 08/07/2022 2010   LEUKOCYTESUR NEGATIVE 08/07/2022 2010   Sepsis Labs: @LABRCNTIP (procalcitonin:4,lacticidven:4) )No results found for this or any previous visit (from the past 240 hour(s)).   Radiological Exams on Admission: CT ABDOMEN PELVIS WO CONTRAST  Result Date: 08/07/2022 CLINICAL DATA:  Metastatic disease evaluation.  Abdominal pain. EXAM: CT ABDOMEN AND PELVIS WITHOUT CONTRAST TECHNIQUE: Multidetector CT imaging of the abdomen and pelvis was performed following the standard protocol without IV contrast. RADIATION DOSE REDUCTION: This exam was performed according to the departmental dose-optimization program which includes automated exposure control, adjustment of the mA and/or kV according to patient size and/or use of iterative reconstruction technique. COMPARISON:  05/01/2022. FINDINGS: Lower chest: Linear scarring in the lung bases. Hepatobiliary: No focal hepatic abnormality. Gallbladder unremarkable. Pancreas: No focal abnormality or ductal dilatation. Spleen: No focal abnormality.  Normal size. Adrenals/Urinary Tract: Cryoablation changes noted in the upper pole of the right kidney, unchanged since prior study. Cysts in the mid and lower pole of the right kidney are unchanged in size since prior study. No hydronephrosis. Adrenal glands and urinary bladder unremarkable. Stomach/Bowel: Stomach, large and small bowel grossly unremarkable. Vascular/Lymphatic: Aortic atherosclerosis. No evidence of aneurysm or adenopathy. Reproductive: Prostate enlargement. Other: No free fluid or free air. Musculoskeletal: No acute bony abnormality. IMPRESSION: Post cryoablation changes noted in the upper pole of the right kidney, unchanged as best determined on  this un enhanced study. Stable cysts in the mid and lower pole of the right kidney. Linear scarring in the lung bases. Aortic atherosclerosis. No acute findings. Electronically Signed   By: Charlett Nose M.D.   On: 08/07/2022 22:13   US Renal  Result Date: 08/07/2022 CLINICAL DATA:  Acute kidney injury. EXAM: RENAL / URINARY TRACT ULTRASOUND COMPLETE COMPARISON:  None Available. FINDINGS: Right Kidney: Renal measurements: 9.8 cm x 4.5 cm x 4.8 cm = volume: 111.1 mL. Echogenicity within normal limits. 1.8 cm x 1.8 cm x 2.0 cm and 3.4 cm x 2.9 cm x 3.2 cm simple cysts are seen within the upper pole and lower pole of the right kidney. No hydronephrosis  is visualized. Left Kidney: Renal measurements: 10.4 cm x 4.9 cm x 5.0 cm = volume: 132.5 mL. Echogenicity within normal limits. No mass or hydronephrosis visualized. Bladder: A 1.6 cm x 0.6 cm x 0.6 cm hyperechoic area is seen along the nondependent wall of the urinary bladder. No flow is seen within this region on color Doppler evaluation. Other: The prostate gland is enlarged and measures 3.6 cm x 4.4 cm x 3.8 cm. IMPRESSION: 1. Findings concerning for the presence of a urinary bladder mass. Urology consult and MRI correlation are recommended, as an underlying neoplastic process cannot be excluded. 2. Large simple right renal cysts. 3. Prostatomegaly. Correlation with the patient's PSA levels is recommended. Electronically Signed   By: Aram Candela M.D.   On: 08/07/2022 19:43    EKG: Independently reviewed. Sinus rhythm.   Assessment/Plan   1. Near syncope  - In sinus rhythm in ED with normal QRS, normal QT, and no blocks   - Check orthostatic vitals, continue cardiac monitoring, check echocardiogram    2. AKI  - SCr is 1.63 on admission, up from 0.95 last month  - No hydronephrosis on [place] in ED  - Likely hemodynamically mediated in setting of low BP  - Check FENa, hold antihypertensives, continue IVF hydration, renally-dose medications, and  repeat chem panel in am    3. Bladder mass  - Noted on [place] in ED  - Discussed with patient, he plans to contact his urologist's (Dr. Lindley Magnus) office for follow-up after discharge    4. Hypertension  - Hold antihypertensives for now    DVT prophylaxis: Lovenox  Code Status: Full  Level of Care: Level of care: Telemetry Family Communication: None present  Disposition Plan:  Patient is from: Home  Anticipated d/c is to: Home Anticipated d/c date is: Possibly as early as 08/09/22 Patient currently: Pending orthostatic vitals, echo, improved/stable renal function  Consults called: None  Admission status: Observation     Briscoe Deutscher, MD [facility]  08/08/2022, 2:45 AM

## 2022-08-08 NOTE — ED Notes (Signed)
[facility] called for transport. 

## 2022-08-08 NOTE — Progress Notes (Signed)
PROGRESS NOTE    Tyler Hunt  ZOX:[id number] DOB: 1945/12/08 DOA: 08/07/2022 PCP: Judd Lien, PA-C     Brief Narrative:  Tyler Hunt is a pleasant 77 y.o. 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   Patient is treated with Norvasc and losartan for hypertension but has noted his blood pressure to be low for the past several days.  Reports a SBP readings as low as 78 and others in the 80s and 90s.  He felt well this morning when he was doing some light housework and suddenly felt as though he is about to pass out.  There was no chest pain or palpitations associated with this.  He took his blood pressure, noted SBP to be in the 80s, and came in for evaluation.   He reports poor appetite and weight loss, but this has been going on for years and he denies any recent vomiting, diarrhea, or change in his oral intake.  States that he does not drink much fluid at home.  New events last 24 hours / Subjective: No new complaints this morning.  Has not been out of bed.  Assessment & Plan:   Principal Problem:   Near syncope Active Problems:   AKI (acute kidney injury)   Bladder mass   Hypertension   Near syncope -Orthostatic vital signs negative 111/63 --> 108/71  -Obtain echocardiogram -Norvasc and losartan on hold  AKI -FeNa 0.41% indicating prerenal injury -Losartan on hold -IV fluid.  Creatinine improved overnight  History of right-sided renal carcinoma status post cryoablation -Follow-up with urology outpatient  Hyperlipidemia -Lipitor  DVT prophylaxis:  enoxaparin (LOVENOX) injection 30 mg Start: 08/08/22 1000  Code Status: Full code Family Communication: No family at bedside Disposition Plan: Home Status is: Observation The patient will require care spanning > 2  midnights and should be moved to inpatient because: IV fluid    Antimicrobials:  Anti-infectives (From admission, onward)    None        Objective: Vitals:   08/08/22 0626 08/08/22 0626 08/08/22 0957 08/08/22 1323  BP:  109/68 117/66 125/67  Pulse:  62 (!) 59 (!) 59  Resp:  Temp: 98.8 F (37.1 C)  97.7 F (36.5 C) 97.7 F (36.5 C)  TempSrc:   Oral Oral  SpO2:  96% 98% 100%  Weight:      Height:        Intake/Output Summary (Last 24 hours) at 08/08/2022 1332 Last data filed at 08/08/2022 0606 Gross per 24 hour  Intake 864.6 ml  Output 1450 ml  Net -585.4 ml   Filed Weights   08/07/22 1657 08/07/22 1835 08/08/22 0233  Weight: 52.2 kg 52.2 kg 52.4 kg    Examination:  General exam: Appears calm and comfortable  Respiratory system: Clear to auscultation. Respiratory effort normal. No respiratory distress. No conversational dyspnea.  Cardiovascular system: S1 & S2 heard, RRR. No murmurs. No pedal edema. Gastrointestinal system: Abdomen is nondistended, soft and nontender. Normal bowel sounds heard. Central nervous system: Alert and oriented. No focal neurological deficits. Speech clear.  Extremities: Symmetric in appearance  Skin: No rashes, lesions or ulcers on exposed skin  Psychiatry: Judgement and insight appear normal. Mood & affect appropriate.   Data Reviewed: I have personally reviewed following labs and imaging studies  CBC: Recent Labs  Lab 08/07/22 1659 08/08/22 0925  WBC 10.4 7.7  HGB 12.4* 11.7*  HCT 37.3* 35.4*  MCV 92.8 93.7  PLT 215 204   Basic Metabolic Panel: Recent Labs  Lab 08/07/22 1659 08/08/22 0925  NA 134* 138  K 4.1 4.0  CL 100 106  CO2 24 24  GLUCOSE 105* 112*  BUN 47* 31*  CREATININE 1.63* 1.00  CALCIUM 8.8* 8.4*  MG  --  2.3   GFR: Estimated Creatinine Clearance: 48 mL/min (by C-G formula based on SCr of 1 mg/dL). Liver Function Tests: No results for input(s): "AST", "ALT", "ALKPHOS", "BILITOT", "PROT",  "ALBUMIN" in the last 168 hours. No results for input(s): "LIPASE", "AMYLASE" in the last 168 hours. No results for input(s): "AMMONIA" in the last 168 hours. Coagulation Profile: No results for input(s): "INR", "PROTIME" in the last 168 hours. Cardiac Enzymes: No results for input(s): "CKTOTAL", "CKMB", "CKMBINDEX", "TROPONINI" in the last 168 hours. BNP (last 3 results) No results for input(s): "PROBNP" in the last 8760 hours. HbA1C: No results for input(s): "HGBA1C" in the last 72 hours. CBG: Recent Labs  Lab 08/07/22 1702 08/08/22 0804  GLUCAP 102* 119*   Lipid Profile: No results for input(s): "CHOL", "HDL", "LDLCALC", "TRIG", "CHOLHDL", "LDLDIRECT" in the last 72 hours. Thyroid Function Tests: No results for input(s): "TSH", "T4TOTAL", "FREET4", "T3FREE", "THYROIDAB" in the last 72 hours. Anemia Panel: No results for input(s): "VITAMINB12", "FOLATE", "FERRITIN", "TIBC", "IRON", "RETICCTPCT" in the last 72 hours. Sepsis Labs: No results for input(s): "PROCALCITON", "LATICACIDVEN" in the last 168 hours.  No results found for this or any previous visit (from the past 240 hour(s)).    Radiology Studies: CT ABDOMEN PELVIS WO CONTRAST  Result Date: 08/07/2022 CLINICAL DATA:  Metastatic disease evaluation.  Abdominal pain. EXAM: CT ABDOMEN AND PELVIS WITHOUT CONTRAST TECHNIQUE: Multidetector CT imaging of the abdomen and pelvis was performed following the standard protocol without IV contrast. RADIATION DOSE REDUCTION: This exam was performed according to the departmental dose-optimization program which includes automated exposure control, adjustment of the mA and/or kV according to patient size and/or use of iterative reconstruction technique. COMPARISON:  05/01/2022. FINDINGS: Lower chest: Linear scarring in the lung bases. Hepatobiliary: No focal hepatic abnormality. Gallbladder unremarkable. Pancreas: No focal abnormality or ductal dilatation. Spleen: No focal abnormality.  Normal  size. Adrenals/Urinary Tract: Cryoablation changes noted in the upper pole of the right kidney, unchanged since prior study. Cysts in the mid and lower pole of the right kidney are unchanged in size since prior study. No hydronephrosis. Adrenal glands and urinary bladder unremarkable. Stomach/Bowel: Stomach, large and small bowel grossly unremarkable. Vascular/Lymphatic: Aortic atherosclerosis. No evidence of aneurysm or adenopathy. Reproductive: Prostate enlargement. Other: No free fluid or free air. Musculoskeletal: No acute bony abnormality. IMPRESSION: Post cryoablation changes noted in the upper pole of the right kidney, unchanged as best determined on this un enhanced study. Stable cysts in the mid and lower pole of the right kidney. Linear scarring in the lung bases. Aortic atherosclerosis. No acute findings. Electronically Signed   By: Charlett Nose M.D.   On: 08/07/2022 22:13   US Renal  Result Date: 08/07/2022 CLINICAL DATA:  Acute kidney injury. EXAM: RENAL / URINARY TRACT ULTRASOUND COMPLETE COMPARISON:  None Available. FINDINGS: Right Kidney: Renal measurements: 9.8 cm x 4.5 cm x 4.8 cm = volume: 111.1 mL. Echogenicity within normal limits. 1.8 cm x 1.8 cm x 2.0 cm and 3.4 cm x 2.9 cm x 3.2 cm simple cysts are seen within the upper  pole and lower pole of the right kidney. No hydronephrosis is visualized. Left Kidney: Renal measurements: 10.4 cm x 4.9 cm x 5.0 cm = volume: 132.5 mL. Echogenicity within normal limits. No mass or hydronephrosis visualized. Bladder: A 1.6 cm x 0.6 cm x 0.6 cm hyperechoic area is seen along the nondependent wall of the urinary bladder. No flow is seen within this region on color Doppler evaluation. Other: The prostate gland is enlarged and measures 3.6 cm x 4.4 cm x 3.8 cm. IMPRESSION: 1. Findings concerning for the presence of a urinary bladder mass. Urology consult and MRI correlation are recommended, as an underlying neoplastic process cannot be excluded. 2. Large  simple right renal cysts. 3. Prostatomegaly. Correlation with the patient's PSA levels is recommended. Electronically Signed   By: Aram Candela M.D.   On: 08/07/2022 19:43      Scheduled Meds:  atorvastatin  10 mg Oral Daily   enoxaparin (LOVENOX) injection  30 mg Subcutaneous Q24H   pantoprazole  40 mg Oral Daily   sodium chloride flush  3 mL Intravenous Q12H   tamsulosin  0.4 mg Oral Daily   Continuous Infusions:  sodium chloride 125 mL/hr at 08/08/22 0606     LOS: 0 days   Time spent: 25 minutes   Noralee Stain, DO [facility] 08/08/2022, 1:32 PM   Available via Epic secure chat 7am-7pm After these hours, please refer to coverage provider listed on [email or web address]

## 2022-08-09 DIAGNOSIS — R55 Syncope and collapse: Secondary | ICD-10-CM | POA: Diagnosis not present

## 2022-08-09 LAB — GLUCOSE, CAPILLARY: Glucose-Capillary: 76 mg/dL (ref 70–99)

## 2022-08-09 NOTE — Discharge Summary (Signed)
Physician Discharge Summary  Tyler Hunt ZOX:[id number] DOB: June 16, 1945 DOA: 08/07/2022  PCP: Judd Lien, PA-C  Admit date: 08/07/2022 Discharge date: 08/09/2022  Admitted From: Home Disposition: Home  Recommendations for Outpatient Follow-up:  Follow up with PCP, repeat BMP as outpatient Losartan discontinued.  Resume Norvasc.  Blood pressure remained stable Follow-up with urology outpatient  Discharge Condition: Stable CODE STATUS: Full code Diet recommendation:  Diet Orders (From admission, onward)     Start     Ordered   08/09/22 0000  Diet general        08/09/22 0929   08/08/22 0240  Diet regular Room service appropriate? Yes; Fluid consistency: Thin  Diet effective now       Question Answer Comment  Room service appropriate? Yes   Fluid consistency: Thin      08/08/22 0241           Brief/Interim Summary: Tyler Hunt is a pleasant 77 y.o. 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   Patient is treated with Norvasc and losartan for hypertension but has noted his blood pressure to be low for the past several days.  Reports a SBP readings as low as 78 and others in the 80s and 90s.  He felt well this morning when he was doing some light housework and suddenly felt as though he is about to pass out.  There was no chest pain or palpitations associated with this.  He took his blood pressure, noted SBP to be in the 80s, and came in for evaluation.   He reports poor appetite and weight loss, but this has been going on for years and he denies any recent vomiting, diarrhea, or change in his oral intake.  States that he does not drink much fluid at home.  Patient's Norvasc and losartan were discontinued.  He was given IV fluid with resolution of AKI.  Echocardiogram  was completed, results as below.  On day of discharge, patient was feeling well.  Oral intake and hydration encouraged.  Discharge Diagnoses:   Principal Problem:   Near syncope Active Problems:   AKI (acute kidney injury)   Bladder mass   Hypertension   Near syncope -Orthostatic vital signs negative 111/63 --> 108/71  -Echocardiogram results as below -Resume Norvasc   AKI -FeNa 0.41% indicating prerenal injury -Losartan on hold -Resolved   History of right-sided renal carcinoma status post cryoablation -Follow-up with urology outpatient   Hyperlipidemia -Lipitor  Discharge Instructions  Discharge Instructions     Call MD for:  difficulty breathing, headache or visual disturbances   Complete by: As directed    Call MD for:  extreme fatigue   Complete by: As directed    Call MD for:  persistant dizziness or light-headedness   Complete by: As directed    Call MD for:  persistant nausea and vomiting   Complete by: As directed    Call MD for:  severe uncontrolled pain   Complete by: As directed    Call MD for:  temperature >100.4   Complete by: As directed    Diet general   Complete by: As directed    Discharge instructions   Complete by: As directed    You were cared for by a hospitalist during your hospital stay. If you have any questions about your discharge medications  or the care you received while you were in the hospital after you are discharged, you can call the unit and ask to speak with the hospitalist on call if the hospitalist that took care of you is not available. Once you are discharged, your primary care physician will handle any further medical issues. Please note that NO REFILLS for any discharge medications will be authorized once you are discharged, as it is imperative that you return to your primary care physician (or establish a relationship with a primary care physician if you do not have one) for your aftercare needs so that they can reassess your  need for medications and monitor your lab values.   Increase activity slowly   Complete by: As directed       Allergies as of 08/09/2022   No Known Allergies      Medication List     STOP taking these medications    losartan 25 MG tablet Commonly known as: COZAAR   meloxicam 15 MG tablet Commonly known as: MOBIC       TAKE these medications    amLODipine 5 MG tablet Commonly known as: NORVASC Take 5 mg by mouth daily.   atorvastatin 10 MG tablet Commonly known as: LIPITOR Take 10 mg by mouth daily.   ferrous sulfate 325 (65 FE) MG tablet Take 325 mg by mouth daily with breakfast.   finasteride 5 MG tablet Commonly known as: PROSCAR Take 5 mg by mouth daily.   gabapentin 300 MG capsule Commonly known as: NEURONTIN Take 300-600 mg by mouth See admin instructions. Take 300mg  by mouth in the morning and the afternoon. Take 600mg  by mouth daily at bedtime.   multivitamin with minerals Tabs tablet Take 1 tablet by mouth daily.   omeprazole 20 MG capsule Commonly known as: PRILOSEC Take 20 mg by mouth daily.   tamsulosin 0.4 MG Caps capsule Commonly known as: FLOMAX Take 0.4 mg by mouth daily.        Follow-up Information     Irving Copas H, PA-C Follow up.   Specialty: Physician Assistant Contact information: [phone] [place] [address] [phone]         [facility] Follow up.   Contact information: [address] [address] [place] [address] [phone]               No Known Allergies    Procedures/Studies: ECHOCARDIOGRAM COMPLETE  Result Date: 08/08/2022    ECHOCARDIOGRAM REPORT   Patient Name:   Tyler Hunt Date of Exam: 08/08/2022 Medical Rec #:  [id number]     Height:       67.0 in Accession #:    [id number]    Weight:       115.5 lb Date of Birth:  1945/11/02     BSA:          1.601 m Patient Age:    76 years      BP:           125/67 mmHg Patient Gender: M             HR:            52 bpm. Exam Location:  Inpatient Procedure: 2D Echo, Cardiac Doppler and Color Doppler Indications:    Syncope  History:        Patient has prior history of Echocardiogram examinations, most  recent 02/16/2020. Arrythmias:Tachycardia and Atrial                 Fibrillation, Signs/Symptoms:Syncope; Risk Factors:Current                 Smoker and Hypertension.  Sonographer:    Milda Smart Referring Phys: [id number] TIMOTHY S OPYD  Sonographer Comments: Image acquisition challenging due to patient body habitus and Image acquisition challenging due to respiratory motion. IMPRESSIONS  1. Left ventricular ejection fraction, by estimation, is 55 to 60%. The left ventricle has normal function. The left ventricle has no regional wall motion abnormalities. Left ventricular diastolic parameters were grossly normal.  2. Right ventricular systolic function is normal. The right ventricular size is normal. There is normal pulmonary artery systolic pressure. The estimated right ventricular systolic pressure is 25.5 mmHg.  3. Right atrial size was mildly dilated.  4. The mitral valve is normal in structure. Trivial mitral valve regurgitation. No evidence of mitral stenosis.  5. The aortic valve is grossly normal. Aortic valve regurgitation is not visualized. No aortic stenosis is present.  6. The inferior vena cava is normal in size with greater than 50% respiratory variability, suggesting right atrial pressure of 3 mmHg. FINDINGS  Left Ventricle: Left ventricular ejection fraction, by estimation, is 55 to 60%. The left ventricle has normal function. The left ventricle has no regional wall motion abnormalities. The left ventricular internal cavity size was normal in size. There is  no left ventricular hypertrophy. Left ventricular diastolic parameters were normal. Right Ventricle: The right ventricular size is normal. No increase in right ventricular wall thickness. Right ventricular systolic function is  normal. There is normal pulmonary artery systolic pressure. The tricuspid regurgitant velocity is 2.37 m/s, and  with an assumed right atrial pressure of 3 mmHg, the estimated right ventricular systolic pressure is 25.5 mmHg. Left Atrium: Left atrial size was normal in size. Right Atrium: Right atrial size was mildly dilated. Pericardium: There is no evidence of pericardial effusion. Mitral Valve: The mitral valve is normal in structure. Trivial mitral valve regurgitation. No evidence of mitral valve stenosis. MV peak gradient, 3.1 mmHg. The mean mitral valve gradient is 1.0 mmHg. Tricuspid Valve: The tricuspid valve is normal in structure. Tricuspid valve regurgitation is trivial. No evidence of tricuspid stenosis. Aortic Valve: The aortic valve is grossly normal. Aortic valve regurgitation is not visualized. No aortic stenosis is present. Pulmonic Valve: The pulmonic valve was normal in structure. Pulmonic valve regurgitation is not visualized. No evidence of pulmonic stenosis. Aorta: The aortic root is normal in size and structure and the ascending aorta was not well visualized. Venous: The inferior vena cava is normal in size with greater than 50% respiratory variability, suggesting right atrial pressure of 3 mmHg. IAS/Shunts: No atrial level shunt detected by color flow Doppler.  LEFT VENTRICLE PLAX 2D LVIDd:         4.50 cm   Diastology LVIDs:         2.80 cm   LV e' medial:    7.51 cm/s LV PW:         0.90 cm   LV E/e' medial:  9.4 LV IVS:        0.80 cm   LV e' lateral:   11.30 cm/s LVOT diam:     1.90 cm   LV E/e' lateral: 6.3 LV SV:         77 LV SV Index:   48 LVOT Area:     2.84 cm  RIGHT VENTRICLE             IVC RV S prime:     11.30 cm/s  IVC diam: 1.60 cm TAPSE (M-mode): 1.9 cm LEFT ATRIUM             Index        RIGHT ATRIUM           Index LA diam:        2.80 cm 1.75 cm/m   RA Area:     15.50 cm LA Vol (A2C):   72.6 ml 45.30 ml/m  RA Volume:   44.80 ml  27.98 ml/m LA Vol (A4C):   29.3 ml  18.27 ml/m LA Biplane Vol: 46.5 ml 29.04 ml/m  AORTIC VALVE LVOT Vmax:   132.00 cm/s LVOT Vmean:  77.800 cm/s LVOT VTI:    0.272 m  AORTA Ao Root diam: 3.30 cm MITRAL VALVE               TRICUSPID VALVE MV Area (PHT): 2.34 cm    TR Peak grad:   22.5 mmHg MV Area VTI:   2.21 cm    TR Vmax:        237.00 cm/s MV Peak grad:  3.1 mmHg MV Mean grad:  1.0 mmHg    SHUNTS MV Vmax:       0.88 m/s    Systemic VTI:  0.27 m MV Vmean:      49.8 cm/s   Systemic Diam: 1.90 cm MV Decel Time: 324 msec MV E velocity: 70.70 cm/s Weston Brass MD Electronically signed by Weston Brass MD Signature Date/Time: 08/08/2022/4:01:16 PM    Final    CT ABDOMEN PELVIS WO CONTRAST  Result Date: 08/07/2022 CLINICAL DATA:  Metastatic disease evaluation.  Abdominal pain. EXAM: CT ABDOMEN AND PELVIS WITHOUT CONTRAST TECHNIQUE: Multidetector CT imaging of the abdomen and pelvis was performed following the standard protocol without IV contrast. RADIATION DOSE REDUCTION: This exam was performed according to the departmental dose-optimization program which includes automated exposure control, adjustment of the mA and/or kV according to patient size and/or use of iterative reconstruction technique. COMPARISON:  05/01/2022. FINDINGS: Lower chest: Linear scarring in the lung bases. Hepatobiliary: No focal hepatic abnormality. Gallbladder unremarkable. Pancreas: No focal abnormality or ductal dilatation. Spleen: No focal abnormality.  Normal size. Adrenals/Urinary Tract: Cryoablation changes noted in the upper pole of the right kidney, unchanged since prior study. Cysts in the mid and lower pole of the right kidney are unchanged in size since prior study. No hydronephrosis. Adrenal glands and urinary bladder unremarkable. Stomach/Bowel: Stomach, large and small bowel grossly unremarkable. Vascular/Lymphatic: Aortic atherosclerosis. No evidence of aneurysm or adenopathy. Reproductive: Prostate enlargement. Other: No free fluid or free air.  Musculoskeletal: No acute bony abnormality. IMPRESSION: Post cryoablation changes noted in the upper pole of the right kidney, unchanged as best determined on this un enhanced study. Stable cysts in the mid and lower pole of the right kidney. Linear scarring in the lung bases. Aortic atherosclerosis. No acute findings. Electronically Signed   By: Charlett Nose M.D.   On: 08/07/2022 22:13   US Renal  Result Date: 08/07/2022 CLINICAL DATA:  Acute kidney injury. EXAM: RENAL / URINARY TRACT ULTRASOUND COMPLETE COMPARISON:  None Available. FINDINGS: Right Kidney: Renal measurements: 9.8 cm x 4.5 cm x 4.8 cm = volume: 111.1 mL. Echogenicity within normal limits. 1.8 cm x 1.8 cm x 2.0 cm and 3.4 cm x 2.9 cm x 3.2 cm simple cysts are seen  within the upper pole and lower pole of the right kidney. No hydronephrosis is visualized. Left Kidney: Renal measurements: 10.4 cm x 4.9 cm x 5.0 cm = volume: 132.5 mL. Echogenicity within normal limits. No mass or hydronephrosis visualized. Bladder: A 1.6 cm x 0.6 cm x 0.6 cm hyperechoic area is seen along the nondependent wall of the urinary bladder. No flow is seen within this region on color Doppler evaluation. Other: The prostate gland is enlarged and measures 3.6 cm x 4.4 cm x 3.8 cm. IMPRESSION: 1. Findings concerning for the presence of a urinary bladder mass. Urology consult and MRI correlation are recommended, as an underlying neoplastic process cannot be excluded. 2. Large simple right renal cysts. 3. Prostatomegaly. Correlation with the patient's PSA levels is recommended. Electronically Signed   By: Aram Candela M.D.   On: 08/07/2022 19:43       Discharge Exam: Vitals:   08/08/22 2028 08/09/22 0545  BP: 138/69 133/70  Pulse: 61 (!) 51  Resp: 18   Temp: 98.2 F (36.8 C) 98.5 F (36.9 C)  SpO2: 99% 99%    General: Pt is alert, awake, not in acute distress Cardiovascular: RRR, S1/S2 +, no edema Respiratory: CTA bilaterally, no wheezing, no rhonchi, no  respiratory distress, no conversational dyspnea  Abdominal: Soft, NT, ND, bowel sounds + Extremities: no edema, no cyanosis Psych: Normal mood and affect, stable judgement and insight     The results of significant diagnostics from this hospitalization (including imaging, microbiology, ancillary and laboratory) are listed below for reference.     Microbiology: No results found for this or any previous visit (from the past 240 hour(s)).   Labs: BNP (last 3 results) No results for input(s): "BNP" in the last 8760 hours. Basic Metabolic Panel: Recent Labs  Lab 08/07/22 1659 08/08/22 0925  NA 134* 138  K 4.1 4.0  CL 100 106  CO2 24 24  GLUCOSE 105* 112*  BUN 47* 31*  CREATININE 1.63* 1.00  CALCIUM 8.8* 8.4*  MG  --  2.3   Liver Function Tests: No results for input(s): "AST", "ALT", "ALKPHOS", "BILITOT", "PROT", "ALBUMIN" in the last 168 hours. No results for input(s): "LIPASE", "AMYLASE" in the last 168 hours. No results for input(s): "AMMONIA" in the last 168 hours. CBC: Recent Labs  Lab 08/07/22 1659 08/08/22 0925  WBC 10.4 7.7  HGB 12.4* 11.7*  HCT 37.3* 35.4*  MCV 92.8 93.7  PLT 215 204   Cardiac Enzymes: No results for input(s): "CKTOTAL", "CKMB", "CKMBINDEX", "TROPONINI" in the last 168 hours. BNP: Invalid input(s): "POCBNP" CBG: Recent Labs  Lab 08/07/22 1702 08/08/22 0804 08/09/22 0611  GLUCAP 102* 119* 76   D-Dimer No results for input(s): "DDIMER" in the last 72 hours. Hgb A1c No results for input(s): "HGBA1C" in the last 72 hours. Lipid Profile No results for input(s): "CHOL", "HDL", "LDLCALC", "TRIG", "CHOLHDL", "LDLDIRECT" in the last 72 hours. Thyroid function studies No results for input(s): "TSH", "T4TOTAL", "T3FREE", "THYROIDAB" in the last 72 hours.  Invalid input(s): "FREET3" Anemia work up No results for input(s): "VITAMINB12", "FOLATE", "FERRITIN", "TIBC", "IRON", "RETICCTPCT" in the last 72 hours. Urinalysis    Component Value  Date/Time   COLORURINE YELLOW 08/07/2022 2010   APPEARANCEUR CLEAR 08/07/2022 2010   LABSPEC 1.025 08/07/2022 2010   PHURINE 5.5 08/07/2022 2010   GLUCOSEU NEGATIVE 08/07/2022 2010   HGBUR NEGATIVE 08/07/2022 2010   BILIRUBINUR NEGATIVE 08/07/2022 2010   KETONESUR NEGATIVE 08/07/2022 2010   PROTEINUR NEGATIVE 08/07/2022 2010  NITRITE NEGATIVE 08/07/2022 2010   LEUKOCYTESUR NEGATIVE 08/07/2022 2010   Sepsis Labs Recent Labs  Lab 08/07/22 1659 08/08/22 0925  WBC 10.4 7.7   Microbiology No results found for this or any previous visit (from the past 240 hour(s)).   Patient was seen and examined on the day of discharge and was found to be in stable condition. Time coordinating discharge: 25 minutes including assessment and coordination of care, as well as examination of the patient.   SIGNED:  Noralee Stain, DO [facility] 08/09/2022, 10:44 AM

## 2022-11-01 ENCOUNTER — Emergency Department (HOSPITAL_BASED_OUTPATIENT_CLINIC_OR_DEPARTMENT_OTHER)
Admission: EM | Admit: 2022-11-01 | Discharge: 2022-11-01 | Disposition: A | Discharge status: Home / Self Care | Payer: Medicare PPO | Attending: Emergency Medicine | Admitting: Emergency Medicine

## 2022-11-01 ENCOUNTER — Encounter (HOSPITAL_BASED_OUTPATIENT_CLINIC_OR_DEPARTMENT_OTHER): Payer: Self-pay | Admitting: Emergency Medicine

## 2022-11-01 DIAGNOSIS — R519 Headache, unspecified: Secondary | ICD-10-CM

## 2022-11-01 DIAGNOSIS — F17210 Nicotine dependence, cigarettes, uncomplicated: Secondary | ICD-10-CM | POA: Insufficient documentation

## 2022-11-01 DIAGNOSIS — I10 Essential (primary) hypertension: Secondary | ICD-10-CM | POA: Diagnosis not present

## 2022-11-01 LAB — BASIC METABOLIC PANEL
Anion gap: 6 (ref 5–15)
BUN: 13 mg/dL (ref 8–23)
CO2: 27 mmol/L (ref 22–32)
Calcium: 9.1 mg/dL (ref 8.9–10.3)
Chloride: 105 mmol/L (ref 98–111)
Creatinine, Ser: 0.71 mg/dL (ref 0.61–1.24)
GFR, Estimated: >60 mL/min (ref >60)
Glucose, Bld: 89 mg/dL (ref 70–99)
Potassium: 5 mmol/L (ref 3.5–5.1)
Sodium: 138 mmol/L (ref 135–145)

## 2022-11-01 LAB — CBC WITH DIFFERENTIAL/PLATELET
Abs Immature Granulocytes: 0.02 10*3/uL (ref 0.00–0.07)
Basophils Absolute: 0 10*3/uL (ref 0.0–0.1)
Basophils Relative: 0 %
Eosinophils Absolute: 0.1 10*3/uL (ref 0.0–0.5)
Eosinophils Relative: 1 %
HCT: 37.5 % — ABNORMAL LOW (ref 39.0–52.0)
Hemoglobin: 12.1 g/dL — ABNORMAL LOW (ref 13.0–17.0)
Immature Granulocytes: 0 %
Lymphocytes Relative: 23 %
Lymphs Abs: 1.7 10*3/uL (ref 0.7–4.0)
MCH: 31.3 pg (ref 26.0–34.0)
MCHC: 32.3 g/dL (ref 30.0–36.0)
MCV: 97.2 fL (ref 80.0–100.0)
Monocytes Absolute: 0.5 10*3/uL (ref 0.1–1.0)
Monocytes Relative: 7 %
Neutro Abs: 5.1 10*3/uL (ref 1.7–7.7)
Neutrophils Relative %: 69 %
Platelets: 273 10*3/uL (ref 150–400)
RBC: 3.86 MIL/uL — ABNORMAL LOW (ref 4.22–5.81)
RDW: 15.1 % (ref 11.5–15.5)
WBC: 7.5 10*3/uL (ref 4.0–10.5)
nRBC: 0 % (ref 0.0–0.2)

## 2022-11-01 MED ORDER — AMLODIPINE BESYLATE 5 MG PO TABS
5.0000 mg | ORAL_TABLET | Freq: Once | ORAL | Status: AC
  Administered 2022-11-01: 5 mg via ORAL
  Filled 2022-11-01: qty 1

## 2022-11-01 MED ORDER — AMLODIPINE BESYLATE 5 MG PO TABS: 5.0000 mg | ORAL_TABLET | Freq: Every day | ORAL | 0 refills | Status: AC

## 2022-11-01 NOTE — ED Triage Notes (Signed)
Pt reports headache since this am.  Tyler Hunt to an urgent care on 7/10 for this and they told him to go the ED if his BP was over 150/90. Denies chest pain, shortness of breath or extremity weakness. Says he sees spots every so often.

## 2022-11-01 NOTE — Discharge Instructions (Signed)
I am starting you back on your amlodipine.  You can take this along with the lisinopril prescribed by urgent care recently.  Please keep track of your blood pressures once or twice daily and a blood pressure log.  Review this with your primary care doctor to make further changes as needed.  If you develop any sudden/severe headache, chest pain, numbness/weakness you should return to the emergency department immediately for reevaluation.

## 2022-11-01 NOTE — ED Provider Notes (Signed)
Emergency Department Provider Note   I have reviewed the triage vital signs and the nursing notes.   HISTORY  Chief Complaint Headache   HPI Tyler Hunt is a 77 y.o. male with past history reviewed below presents emergency department with elevated blood pressures at home.  Patient reports mild headache this morning with contractions blood pressures over the past several days and 150-160 range at home.  He states his PCP and ophthalmologist blood pressure medication several weeks ago. He says his blood pressures are actually running on the lower side with that combination.  He has since had lisinopril started during an urgent care visit. He developed a HA today but was gradual onset   Past Medical History:  Diagnosis Date   Acid reflux    Atrial fibrillation (HCC)    01-2013 - identified on holter monitor, RVR with abberration   Benign hypertension    Carotid bruit    Carotid bruit    Chest pain 02/2013   CP in left anterior and left lateral chest. Described as pressure-like & sharp. Pain is moderate & worsening. Sx exacerbated by lying down and spicy foods. Has associated heartburn, nausea & weakness.   DOE (dyspnea on exertion)    Enlarged prostate    GI bleed 2014   Palpitations    SOB (shortness of breath)    SVT (supraventricular tachycardia)    Sx include palpitations, chest pain and near syncope. Pain located in substernal area.   Syncope and collapse 02/2013   felt to be vasovagal.    Tobacco abuse    Ventricular tachycardia (HCC)    ECHO 02/15/13 - EF between 55-60%    Review of Systems  Constitutional: No fever/chills Cardiovascular: Denies chest pain. Positive elevated BP.  Respiratory: Denies shortness of breath. Gastrointestinal: No abdominal pain.  No nausea, no vomiting.  Genitourinary: Negative for dysuria. Musculoskeletal: Negative for back pain. Skin: Negative for rash. Neurological: Negative for focal weakness or numbness. Positive HA.    ____________________________________________   PHYSICAL EXAM:  VITAL SIGNS: ED Triage Vitals  Encounter Vitals Group     BP 11/01/22 1406 (!) 172/76     Pulse Rate 11/01/22 1406 (!) 59     Resp 11/01/22 1406 14     Temp 11/01/22 1406 98.3 F (36.8 C)     Temp src --      SpO2 11/01/22 1406 99 %     Weight 11/01/22 1414 115 lb (52.2 kg)     Height 11/01/22 1414 5\' 7"  (1.702 m)   Constitutional: Alert and oriented. Well appearing and in no acute distress. Eyes: Conjunctivae are normal. PERRL.  Head: Atraumatic. Nose: No congestion/rhinnorhea. Mouth/Throat: Mucous membranes are moist.  Neck: No stridor. Cardiovascular: Normal rate, regular rhythm. Good peripheral circulation. Grossly normal heart sounds.   Respiratory: Normal respiratory effort.  No retractions. Lungs CTAB. Gastrointestinal: Soft and nontender. No distention.  Musculoskeletal: No lower extremity tenderness nor edema. No gross deformities of extremities. Neurologic:  Normal speech and language. No gross focal neurologic deficits are appreciated.  Skin:  Skin is warm, dry and intact. No rash noted.  ____________________________________________   LABS (all labs ordered are listed, but only abnormal results are displayed)  Labs Reviewed  CBC WITH DIFFERENTIAL/PLATELET - Abnormal; Notable for the following components:      Result Value   RBC 3.86 (*)    Hemoglobin 12.1 (*)    HCT 37.5 (*)    All other components within normal limits  BASIC  METABOLIC PANEL    ____________________________________________   PROCEDURES  Procedure(s) performed:   Procedures  None  ____________________________________________   INITIAL IMPRESSION / ASSESSMENT AND PLAN / ED COURSE  Pertinent labs & imaging results that were available during my care of the patient were reviewed by me and considered in my medical decision making (see chart for details).   This patient is Presenting for Evaluation of HA, which does  require a range of treatment options, and is a complaint that involves a high risk of morbidity and mortality.  The Differential Diagnoses includes but is not exclusive to subarachnoid hemorrhage, meningitis, encephalitis, previous head trauma, cavernous venous thrombosis, muscle tension headache, glaucoma, temporal arteritis, migraine or migraine equivalent, etc.   Critical Interventions-    Medications  amLODipine (NORVASC) tablet 5 mg (5 mg Oral Given 11/01/22 1712)    Reassessment after intervention: HA improved and minimal at this time.    I did obtain Additional Historical Information from family at bedside.   Clinical Laboratory Tests Ordered, included CBC without leukocytosis.  Mild anemia at 12.1.  No AKI.   Radiologic Tests: Considered CT head but patient with no neurodeficits and headache symptoms are minimal at this time.   Cardiac Monitor Tracing which shows NSR.    Social Determinants of Health Risk patient is a smoker.   Medical Decision Making: Summary:  Presents emergency department with elevated blood pressure and mild headache.  No sudden onset, maximal intensity headache symptoms.  No focal neurodeficits to suspect stroke.  No evidence of endorgan damage on labs.  Plan to start his amlodipine back as he is tolerating this well previously.  He will continue with lisinopril and follow close with PCP for BP re-check.   Patient's presentation is most consistent with acute presentation with potential threat to life or bodily function.   Disposition: discharge  ____________________________________________  FINAL CLINICAL IMPRESSION(S) / ED DIAGNOSES  Final diagnoses:  Hypertension, unspecified type  Acute nonintractable headache, unspecified headache type    Note:  This document was prepared using Dragon voice recognition software and may include unintentional dictation errors.  Alona Bene, MD, [facility]    Alianna Wurster, Arlyss Repress, MD 11/01/22  [phone]
